# Patient Record
Sex: Female | Born: 1945 | Race: White | Hispanic: No | Smoking: Never smoker
Health system: Southern US, Community
[De-identification: ages and names within clinical notes are randomized; demographics above are authoritative.]

## PROBLEM LIST (undated history)

## (undated) DIAGNOSIS — N183 Chronic kidney disease, stage 3 unspecified: Secondary | ICD-10-CM

## (undated) DIAGNOSIS — K2100 Gastro-esophageal reflux disease with esophagitis, without bleeding: Secondary | ICD-10-CM

## (undated) DIAGNOSIS — I129 Hypertensive chronic kidney disease with stage 1 through stage 4 chronic kidney disease, or unspecified chronic kidney disease: Secondary | ICD-10-CM

## (undated) DIAGNOSIS — E039 Hypothyroidism, unspecified: Secondary | ICD-10-CM

## (undated) DIAGNOSIS — E663 Overweight: Secondary | ICD-10-CM

## (undated) DIAGNOSIS — F0390 Unspecified dementia without behavioral disturbance: Secondary | ICD-10-CM

## (undated) DIAGNOSIS — R7303 Prediabetes: Secondary | ICD-10-CM

## (undated) DIAGNOSIS — E785 Hyperlipidemia, unspecified: Secondary | ICD-10-CM

## (undated) HISTORY — DX: Prediabetes: R73.03

## (undated) HISTORY — DX: Hypothyroidism, unspecified: E03.9

## (undated) HISTORY — DX: Gastro-esophageal reflux disease with esophagitis: K21.0

## (undated) HISTORY — DX: Hypertensive chronic kidney disease with stage 1 through stage 4 chronic kidney disease, or unspecified chronic kidney disease: I12.9

## (undated) HISTORY — DX: Chronic kidney disease, stage 3 (moderate): N18.3

## (undated) HISTORY — PX: WRIST FRACTURE SURGERY: SHX121

## (undated) HISTORY — DX: Chronic kidney disease, stage 3 unspecified: N18.30

## (undated) HISTORY — DX: Hyperlipidemia, unspecified: E78.5

## (undated) HISTORY — DX: Unspecified dementia, unspecified severity, without behavioral disturbance, psychotic disturbance, mood disturbance, and anxiety: F03.90

## (undated) HISTORY — DX: Gastro-esophageal reflux disease with esophagitis, without bleeding: K21.00

## (undated) HISTORY — DX: Overweight: E66.3

---

## 1999-02-02 ENCOUNTER — Ambulatory Visit (HOSPITAL_BASED_OUTPATIENT_CLINIC_OR_DEPARTMENT_OTHER): Admission: RE | Admit: 1999-02-02 | Discharge: 1999-02-02 | Payer: Self-pay | Admitting: Orthopedic Surgery

## 1999-03-04 ENCOUNTER — Ambulatory Visit (HOSPITAL_BASED_OUTPATIENT_CLINIC_OR_DEPARTMENT_OTHER): Admission: RE | Admit: 1999-03-04 | Discharge: 1999-03-04 | Payer: Self-pay | Admitting: Orthopedic Surgery

## 2013-01-10 DIAGNOSIS — E7800 Pure hypercholesterolemia, unspecified: Secondary | ICD-10-CM | POA: Insufficient documentation

## 2013-07-24 DIAGNOSIS — G309 Alzheimer's disease, unspecified: Secondary | ICD-10-CM

## 2013-07-24 DIAGNOSIS — F0280 Dementia in other diseases classified elsewhere without behavioral disturbance: Secondary | ICD-10-CM | POA: Insufficient documentation

## 2014-02-14 ENCOUNTER — Other Ambulatory Visit (HOSPITAL_COMMUNITY)
Admission: RE | Admit: 2014-02-14 | Discharge: 2014-02-14 | Disposition: A | Discharge status: Home / Self Care | Payer: Medicare HMO | Source: Ambulatory Visit | Attending: Ophthalmology | Admitting: Ophthalmology

## 2014-02-14 ENCOUNTER — Other Ambulatory Visit: Payer: Self-pay | Admitting: Ophthalmology

## 2014-02-14 DIAGNOSIS — Z0389 Encounter for observation for other suspected diseases and conditions ruled out: Secondary | ICD-10-CM | POA: Insufficient documentation

## 2014-02-24 ENCOUNTER — Ambulatory Visit (HOSPITAL_COMMUNITY)
Admission: RE | Admit: 2014-02-24 | Discharge: 2014-02-24 | Disposition: A | Discharge status: Home / Self Care | Payer: Medicare HMO | Source: Ambulatory Visit | Attending: Ophthalmology | Admitting: Ophthalmology

## 2014-02-24 ENCOUNTER — Other Ambulatory Visit (HOSPITAL_COMMUNITY): Payer: Self-pay | Admitting: Ophthalmology

## 2014-02-24 DIAGNOSIS — H579 Unspecified disorder of eye and adnexa: Secondary | ICD-10-CM | POA: Diagnosis present

## 2014-02-24 DIAGNOSIS — D869 Sarcoidosis, unspecified: Secondary | ICD-10-CM | POA: Diagnosis not present

## 2014-02-24 DIAGNOSIS — I700 Atherosclerosis of aorta: Secondary | ICD-10-CM | POA: Diagnosis not present

## 2014-10-21 DIAGNOSIS — R002 Palpitations: Secondary | ICD-10-CM | POA: Insufficient documentation

## 2015-03-28 IMAGING — DX DG CHEST 2V
2 series · 2 of 2 positions shown · non-contrast
Comparison: 11/15/2012

CLINICAL DATA: Sarcoidosis.  Left eye problems.

EXAM:
CHEST  2 VIEW

[chest pa]
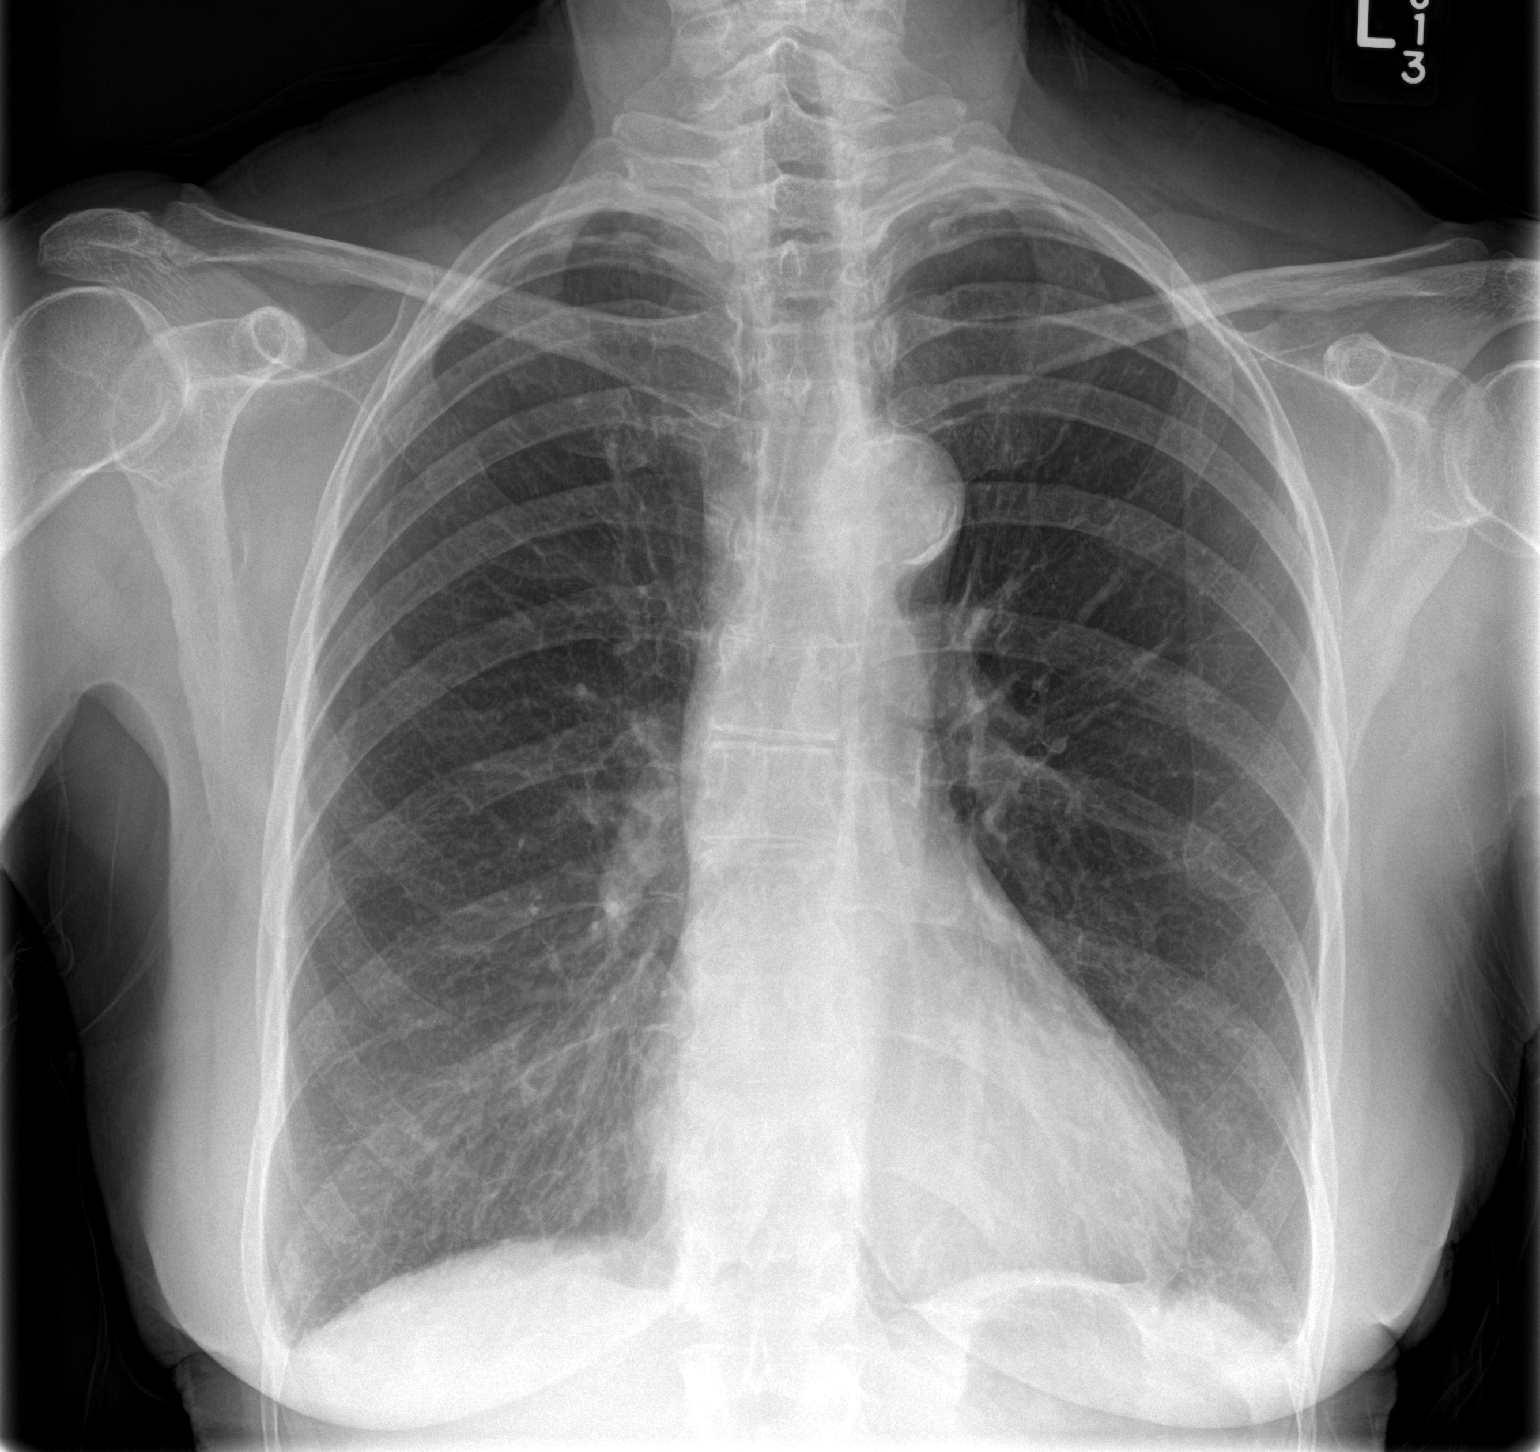

[chest lat]
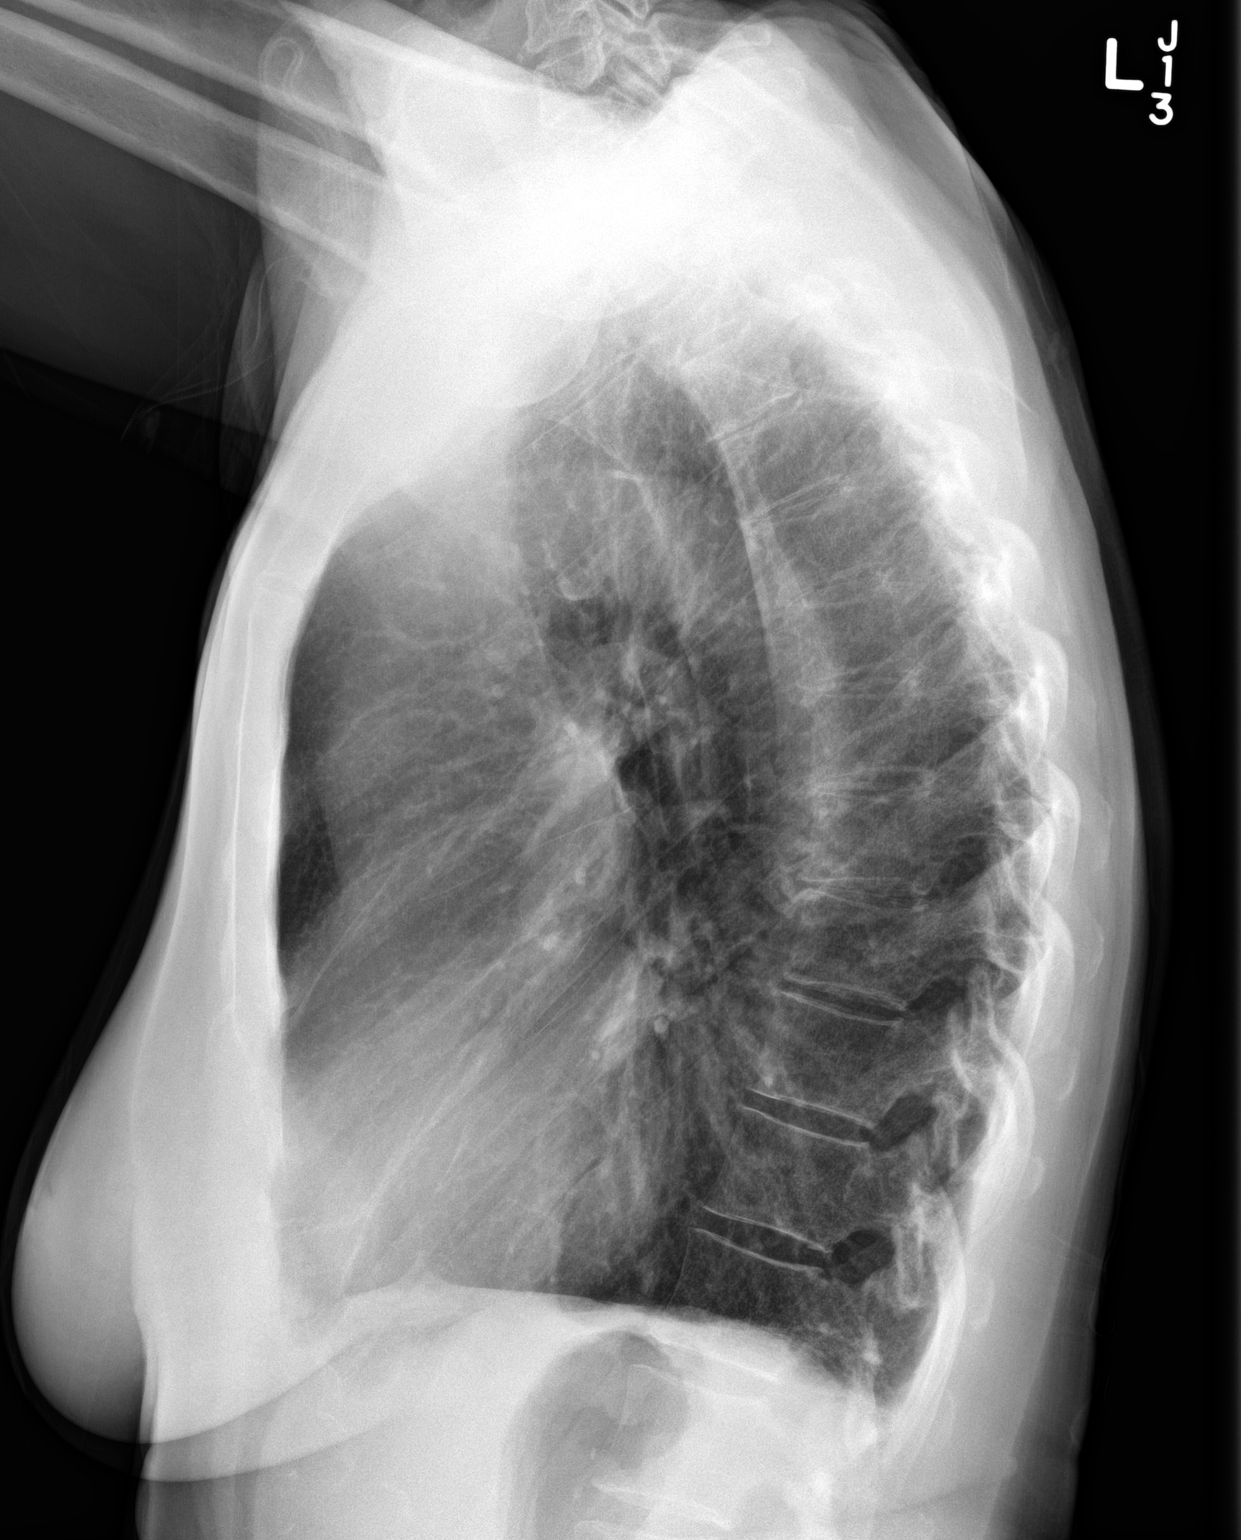

[2 of 2 positions shown; findings below may reference images not displayed]

FINDINGS: There is hyperinflation. Probable scarring at the lung apices.
Normal appearance of the heart and mediastinum. The thoracic aorta
has atherosclerotic calcifications. No acute bone abnormality. No
focal airspace disease.
IMPRESSION: Hyperinflation without acute chest findings.

## 2015-04-15 DIAGNOSIS — Z79899 Other long term (current) drug therapy: Secondary | ICD-10-CM | POA: Diagnosis not present

## 2015-04-15 DIAGNOSIS — N183 Chronic kidney disease, stage 3 (moderate): Secondary | ICD-10-CM | POA: Diagnosis not present

## 2015-04-15 DIAGNOSIS — E039 Hypothyroidism, unspecified: Secondary | ICD-10-CM | POA: Diagnosis not present

## 2015-04-15 DIAGNOSIS — K5909 Other constipation: Secondary | ICD-10-CM | POA: Diagnosis not present

## 2015-04-15 DIAGNOSIS — I129 Hypertensive chronic kidney disease with stage 1 through stage 4 chronic kidney disease, or unspecified chronic kidney disease: Secondary | ICD-10-CM | POA: Diagnosis not present

## 2015-04-15 DIAGNOSIS — E785 Hyperlipidemia, unspecified: Secondary | ICD-10-CM | POA: Diagnosis not present

## 2015-05-07 DIAGNOSIS — M797 Fibromyalgia: Secondary | ICD-10-CM | POA: Diagnosis not present

## 2015-05-07 DIAGNOSIS — Z79899 Other long term (current) drug therapy: Secondary | ICD-10-CM | POA: Diagnosis not present

## 2015-05-07 DIAGNOSIS — L4059 Other psoriatic arthropathy: Secondary | ICD-10-CM | POA: Diagnosis not present

## 2015-05-07 DIAGNOSIS — L409 Psoriasis, unspecified: Secondary | ICD-10-CM | POA: Diagnosis not present

## 2015-06-03 DIAGNOSIS — J22 Unspecified acute lower respiratory infection: Secondary | ICD-10-CM | POA: Diagnosis not present

## 2015-06-03 DIAGNOSIS — R1084 Generalized abdominal pain: Secondary | ICD-10-CM | POA: Diagnosis not present

## 2015-06-03 DIAGNOSIS — G309 Alzheimer's disease, unspecified: Secondary | ICD-10-CM | POA: Diagnosis not present

## 2015-06-19 DIAGNOSIS — Z1231 Encounter for screening mammogram for malignant neoplasm of breast: Secondary | ICD-10-CM | POA: Diagnosis not present

## 2016-04-06 DIAGNOSIS — R51 Headache: Secondary | ICD-10-CM | POA: Diagnosis not present

## 2016-04-06 DIAGNOSIS — M79604 Pain in right leg: Secondary | ICD-10-CM | POA: Diagnosis not present

## 2016-04-06 DIAGNOSIS — M7989 Other specified soft tissue disorders: Secondary | ICD-10-CM | POA: Diagnosis not present

## 2016-04-06 DIAGNOSIS — R9082 White matter disease, unspecified: Secondary | ICD-10-CM | POA: Diagnosis not present

## 2016-04-07 DIAGNOSIS — M79661 Pain in right lower leg: Secondary | ICD-10-CM | POA: Diagnosis not present

## 2016-04-07 DIAGNOSIS — R51 Headache: Secondary | ICD-10-CM | POA: Diagnosis not present

## 2016-04-07 DIAGNOSIS — M7989 Other specified soft tissue disorders: Secondary | ICD-10-CM | POA: Diagnosis not present

## 2016-04-15 DIAGNOSIS — M7121 Synovial cyst of popliteal space [Baker], right knee: Secondary | ICD-10-CM | POA: Diagnosis not present

## 2016-04-15 DIAGNOSIS — M79661 Pain in right lower leg: Secondary | ICD-10-CM | POA: Diagnosis not present

## 2016-04-15 DIAGNOSIS — M25561 Pain in right knee: Secondary | ICD-10-CM | POA: Diagnosis not present

## 2016-04-15 DIAGNOSIS — M7989 Other specified soft tissue disorders: Secondary | ICD-10-CM | POA: Diagnosis not present

## 2016-04-19 DIAGNOSIS — M47812 Spondylosis without myelopathy or radiculopathy, cervical region: Secondary | ICD-10-CM | POA: Diagnosis not present

## 2016-04-19 DIAGNOSIS — R600 Localized edema: Secondary | ICD-10-CM | POA: Diagnosis not present

## 2016-04-19 DIAGNOSIS — M79661 Pain in right lower leg: Secondary | ICD-10-CM | POA: Diagnosis not present

## 2016-07-08 DIAGNOSIS — G309 Alzheimer's disease, unspecified: Secondary | ICD-10-CM | POA: Diagnosis not present

## 2016-07-08 DIAGNOSIS — L4050 Arthropathic psoriasis, unspecified: Secondary | ICD-10-CM | POA: Diagnosis not present

## 2016-07-08 DIAGNOSIS — M2550 Pain in unspecified joint: Secondary | ICD-10-CM | POA: Diagnosis not present

## 2016-07-22 DIAGNOSIS — L409 Psoriasis, unspecified: Secondary | ICD-10-CM | POA: Diagnosis not present

## 2016-07-22 DIAGNOSIS — Z79899 Other long term (current) drug therapy: Secondary | ICD-10-CM | POA: Diagnosis not present

## 2016-07-22 DIAGNOSIS — M797 Fibromyalgia: Secondary | ICD-10-CM | POA: Diagnosis not present

## 2016-07-22 DIAGNOSIS — L4059 Other psoriatic arthropathy: Secondary | ICD-10-CM | POA: Diagnosis not present

## 2016-08-23 DIAGNOSIS — R252 Cramp and spasm: Secondary | ICD-10-CM | POA: Diagnosis not present

## 2016-10-21 DIAGNOSIS — L4059 Other psoriatic arthropathy: Secondary | ICD-10-CM | POA: Diagnosis not present

## 2016-10-21 DIAGNOSIS — Z79899 Other long term (current) drug therapy: Secondary | ICD-10-CM | POA: Diagnosis not present

## 2016-10-21 DIAGNOSIS — L409 Psoriasis, unspecified: Secondary | ICD-10-CM | POA: Diagnosis not present

## 2016-10-21 DIAGNOSIS — M797 Fibromyalgia: Secondary | ICD-10-CM | POA: Diagnosis not present

## 2016-10-26 DIAGNOSIS — R739 Hyperglycemia, unspecified: Secondary | ICD-10-CM | POA: Diagnosis not present

## 2016-10-26 DIAGNOSIS — Z1389 Encounter for screening for other disorder: Secondary | ICD-10-CM | POA: Diagnosis not present

## 2016-10-26 DIAGNOSIS — N183 Chronic kidney disease, stage 3 (moderate): Secondary | ICD-10-CM | POA: Diagnosis not present

## 2016-10-26 DIAGNOSIS — E785 Hyperlipidemia, unspecified: Secondary | ICD-10-CM | POA: Diagnosis not present

## 2016-10-26 DIAGNOSIS — I129 Hypertensive chronic kidney disease with stage 1 through stage 4 chronic kidney disease, or unspecified chronic kidney disease: Secondary | ICD-10-CM | POA: Diagnosis not present

## 2016-10-26 DIAGNOSIS — E039 Hypothyroidism, unspecified: Secondary | ICD-10-CM | POA: Diagnosis not present

## 2016-10-26 DIAGNOSIS — I10 Essential (primary) hypertension: Secondary | ICD-10-CM | POA: Diagnosis not present

## 2016-10-26 DIAGNOSIS — Z0000 Encounter for general adult medical examination without abnormal findings: Secondary | ICD-10-CM | POA: Diagnosis not present

## 2017-02-08 DIAGNOSIS — M25511 Pain in right shoulder: Secondary | ICD-10-CM | POA: Diagnosis not present

## 2017-02-21 DIAGNOSIS — L4059 Other psoriatic arthropathy: Secondary | ICD-10-CM | POA: Diagnosis not present

## 2017-02-21 DIAGNOSIS — L409 Psoriasis, unspecified: Secondary | ICD-10-CM | POA: Diagnosis not present

## 2017-02-21 DIAGNOSIS — M797 Fibromyalgia: Secondary | ICD-10-CM | POA: Diagnosis not present

## 2017-02-21 DIAGNOSIS — Z79899 Other long term (current) drug therapy: Secondary | ICD-10-CM | POA: Diagnosis not present

## 2017-02-21 DIAGNOSIS — R7989 Other specified abnormal findings of blood chemistry: Secondary | ICD-10-CM | POA: Diagnosis not present

## 2017-02-28 DIAGNOSIS — N183 Chronic kidney disease, stage 3 (moderate): Secondary | ICD-10-CM | POA: Diagnosis not present

## 2017-02-28 DIAGNOSIS — R7303 Prediabetes: Secondary | ICD-10-CM | POA: Diagnosis not present

## 2017-02-28 DIAGNOSIS — I129 Hypertensive chronic kidney disease with stage 1 through stage 4 chronic kidney disease, or unspecified chronic kidney disease: Secondary | ICD-10-CM | POA: Diagnosis not present

## 2017-02-28 DIAGNOSIS — E785 Hyperlipidemia, unspecified: Secondary | ICD-10-CM | POA: Diagnosis not present

## 2017-02-28 DIAGNOSIS — E559 Vitamin D deficiency, unspecified: Secondary | ICD-10-CM | POA: Diagnosis not present

## 2017-02-28 DIAGNOSIS — G309 Alzheimer's disease, unspecified: Secondary | ICD-10-CM | POA: Diagnosis not present

## 2017-02-28 DIAGNOSIS — Z23 Encounter for immunization: Secondary | ICD-10-CM | POA: Diagnosis not present

## 2017-02-28 DIAGNOSIS — E039 Hypothyroidism, unspecified: Secondary | ICD-10-CM | POA: Diagnosis not present

## 2017-03-17 DIAGNOSIS — N179 Acute kidney failure, unspecified: Secondary | ICD-10-CM | POA: Diagnosis not present

## 2017-03-19 DIAGNOSIS — S52592A Other fractures of lower end of left radius, initial encounter for closed fracture: Secondary | ICD-10-CM | POA: Diagnosis not present

## 2017-03-19 DIAGNOSIS — S52612A Displaced fracture of left ulna styloid process, initial encounter for closed fracture: Secondary | ICD-10-CM | POA: Diagnosis not present

## 2017-03-19 DIAGNOSIS — S62102A Fracture of unspecified carpal bone, left wrist, initial encounter for closed fracture: Secondary | ICD-10-CM | POA: Diagnosis not present

## 2017-03-19 DIAGNOSIS — S52572A Other intraarticular fracture of lower end of left radius, initial encounter for closed fracture: Secondary | ICD-10-CM | POA: Diagnosis not present

## 2017-03-20 DIAGNOSIS — S62002A Unspecified fracture of navicular [scaphoid] bone of left wrist, initial encounter for closed fracture: Secondary | ICD-10-CM | POA: Diagnosis not present

## 2017-03-24 DIAGNOSIS — S52502A Unspecified fracture of the lower end of left radius, initial encounter for closed fracture: Secondary | ICD-10-CM | POA: Diagnosis not present

## 2017-03-27 DIAGNOSIS — S52502A Unspecified fracture of the lower end of left radius, initial encounter for closed fracture: Secondary | ICD-10-CM | POA: Diagnosis not present

## 2017-03-27 DIAGNOSIS — I10 Essential (primary) hypertension: Secondary | ICD-10-CM | POA: Diagnosis not present

## 2017-03-27 DIAGNOSIS — E7800 Pure hypercholesterolemia, unspecified: Secondary | ICD-10-CM | POA: Diagnosis not present

## 2017-03-27 DIAGNOSIS — N3281 Overactive bladder: Secondary | ICD-10-CM | POA: Diagnosis not present

## 2017-03-27 DIAGNOSIS — S52572A Other intraarticular fracture of lower end of left radius, initial encounter for closed fracture: Secondary | ICD-10-CM | POA: Diagnosis not present

## 2017-03-27 DIAGNOSIS — E039 Hypothyroidism, unspecified: Secondary | ICD-10-CM | POA: Diagnosis not present

## 2017-03-27 DIAGNOSIS — Z79899 Other long term (current) drug therapy: Secondary | ICD-10-CM | POA: Diagnosis not present

## 2017-03-27 DIAGNOSIS — G8918 Other acute postprocedural pain: Secondary | ICD-10-CM | POA: Diagnosis not present

## 2017-03-27 DIAGNOSIS — E785 Hyperlipidemia, unspecified: Secondary | ICD-10-CM | POA: Diagnosis not present

## 2017-03-27 DIAGNOSIS — M1990 Unspecified osteoarthritis, unspecified site: Secondary | ICD-10-CM | POA: Diagnosis not present

## 2017-03-27 DIAGNOSIS — M4316 Spondylolisthesis, lumbar region: Secondary | ICD-10-CM | POA: Diagnosis not present

## 2017-03-27 DIAGNOSIS — G309 Alzheimer's disease, unspecified: Secondary | ICD-10-CM | POA: Diagnosis not present

## 2017-04-07 DIAGNOSIS — S52502A Unspecified fracture of the lower end of left radius, initial encounter for closed fracture: Secondary | ICD-10-CM | POA: Diagnosis not present

## 2017-04-11 DIAGNOSIS — M25512 Pain in left shoulder: Secondary | ICD-10-CM | POA: Diagnosis not present

## 2017-04-11 DIAGNOSIS — M25532 Pain in left wrist: Secondary | ICD-10-CM | POA: Diagnosis not present

## 2017-04-11 DIAGNOSIS — M25432 Effusion, left wrist: Secondary | ICD-10-CM | POA: Diagnosis not present

## 2017-04-11 DIAGNOSIS — M25632 Stiffness of left wrist, not elsewhere classified: Secondary | ICD-10-CM | POA: Diagnosis not present

## 2017-04-17 DIAGNOSIS — M25512 Pain in left shoulder: Secondary | ICD-10-CM | POA: Diagnosis not present

## 2017-04-17 DIAGNOSIS — M25432 Effusion, left wrist: Secondary | ICD-10-CM | POA: Diagnosis not present

## 2017-04-17 DIAGNOSIS — M25532 Pain in left wrist: Secondary | ICD-10-CM | POA: Diagnosis not present

## 2017-04-17 DIAGNOSIS — M25632 Stiffness of left wrist, not elsewhere classified: Secondary | ICD-10-CM | POA: Diagnosis not present

## 2017-04-25 DIAGNOSIS — R05 Cough: Secondary | ICD-10-CM | POA: Diagnosis not present

## 2017-04-25 DIAGNOSIS — J22 Unspecified acute lower respiratory infection: Secondary | ICD-10-CM | POA: Diagnosis not present

## 2017-05-09 DIAGNOSIS — S52509A Unspecified fracture of the lower end of unspecified radius, initial encounter for closed fracture: Secondary | ICD-10-CM | POA: Diagnosis not present

## 2017-05-15 DIAGNOSIS — J22 Unspecified acute lower respiratory infection: Secondary | ICD-10-CM | POA: Diagnosis not present

## 2017-06-13 DIAGNOSIS — S52502D Unspecified fracture of the lower end of left radius, subsequent encounter for closed fracture with routine healing: Secondary | ICD-10-CM | POA: Diagnosis not present

## 2017-06-14 ENCOUNTER — Encounter: Payer: Self-pay | Admitting: Neurology

## 2017-08-10 DIAGNOSIS — J0100 Acute maxillary sinusitis, unspecified: Secondary | ICD-10-CM | POA: Diagnosis not present

## 2017-08-17 DIAGNOSIS — Z13820 Encounter for screening for osteoporosis: Secondary | ICD-10-CM | POA: Diagnosis not present

## 2017-08-17 DIAGNOSIS — M899 Disorder of bone, unspecified: Secondary | ICD-10-CM | POA: Diagnosis not present

## 2017-08-23 DIAGNOSIS — Z79899 Other long term (current) drug therapy: Secondary | ICD-10-CM | POA: Diagnosis not present

## 2017-08-23 DIAGNOSIS — L4059 Other psoriatic arthropathy: Secondary | ICD-10-CM | POA: Diagnosis not present

## 2017-08-23 DIAGNOSIS — M797 Fibromyalgia: Secondary | ICD-10-CM | POA: Diagnosis not present

## 2017-08-23 DIAGNOSIS — R7989 Other specified abnormal findings of blood chemistry: Secondary | ICD-10-CM | POA: Diagnosis not present

## 2017-08-23 DIAGNOSIS — L409 Psoriasis, unspecified: Secondary | ICD-10-CM | POA: Diagnosis not present

## 2017-09-01 ENCOUNTER — Ambulatory Visit: Payer: Medicare Other | Admitting: Neurology

## 2017-09-01 ENCOUNTER — Other Ambulatory Visit: Payer: Self-pay

## 2017-09-01 ENCOUNTER — Encounter: Payer: Self-pay | Admitting: Neurology

## 2017-09-01 VITALS — BP 112/74 | HR 52 | Wt 155.0 lb

## 2017-09-01 DIAGNOSIS — F02818 Dementia in other diseases classified elsewhere, unspecified severity, with other behavioral disturbance: Secondary | ICD-10-CM

## 2017-09-01 DIAGNOSIS — F0281 Dementia in other diseases classified elsewhere with behavioral disturbance: Secondary | ICD-10-CM | POA: Diagnosis not present

## 2017-09-01 DIAGNOSIS — G301 Alzheimer's disease with late onset: Secondary | ICD-10-CM

## 2017-09-01 MED ORDER — MEMANTINE HCL ER 28 MG PO CP24: 28.0000 mg | ORAL_CAPSULE | Freq: Every day | ORAL | 3 refills | Status: DC

## 2017-09-01 MED ORDER — GALANTAMINE HYDROBROMIDE ER 8 MG PO CP24: ORAL_CAPSULE | ORAL | 3 refills | Status: DC

## 2017-09-01 NOTE — Patient Instructions (Signed)
1. Continue Galantamine ER 8mg  3 tablets daily and Namenda XR 28mg  daily 2. If behavioral changes worsen, we can start a medication to help some 3. Follow-up in 6 months, call for any changes  FALL PRECAUTIONS: Be cautious when walking. Scan the area for obstacles that may increase the risk of trips and falls. When getting up in the mornings, sit up at the edge of the bed for a few minutes before getting out of bed. Consider elevating the bed at the head end to avoid drop of blood pressure when getting up. Walk always in a well-lit room (use night lights in the walls). Avoid area rugs or power cords from appliances in the middle of the walkways. Use a walker or a cane if necessary and consider physical therapy for balance exercise. Get your eyesight checked regularly.  FINANCIAL OVERSIGHT: Supervision, especially oversight when making financial decisions or transactions is also recommended.  HOME SAFETY: Consider the safety of the kitchen when operating appliances like stoves, microwave oven, and blender. Consider having supervision and share cooking responsibilities until no longer able to participate in those. Accidents with firearms and other hazards in the house should be identified and addressed as well.  DRIVING: Regarding driving, in patients with progressive memory problems, driving will be impaired. We advise to have someone else do the driving if trouble finding directions or if minor accidents are reported. Independent driving assessment is available to determine safety of driving.  ABILITY TO BE LEFT ALONE: If patient is unable to contact 911 operator, consider using LifeLine, or when the need is there, arrange for someone to stay with patients. Smoking is a fire hazard, consider supervision or cessation. Risk of wandering should be assessed by caregiver and if detected at any point, supervision and safe proof recommendations should be instituted.  MEDICATION SUPERVISION: Inability to  self-administer medication needs to be constantly addressed. Implement a mechanism to ensure safe administration of the medications.  RECOMMENDATIONS FOR ALL PATIENTS WITH MEMORY PROBLEMS: 1. Continue to exercise (Recommend 30 minutes of walking everyday, or 3 hours every week) 2. Increase social interactions - continue going to [place] and enjoy social gatherings with friends and family 3. Eat healthy, avoid fried foods and eat more fruits and vegetables 4. Maintain adequate blood pressure, blood sugar, and blood cholesterol level. Reducing the risk of stroke and cardiovascular disease also helps promoting better memory. 5. Avoid stressful situations. Live a simple life and avoid aggravations. Organize your time and prepare for the next day in anticipation. 6. Sleep well, avoid any interruptions of sleep and avoid any distractions in the bedroom that may interfere with adequate sleep quality 7. Avoid sugar, avoid sweets as there is a strong link between excessive sugar intake, diabetes, and cognitive impairment The Mediterranean diet has been shown to help patients reduce the risk of progressive memory disorders and reduces cardiovascular risk. This includes eating fish, eat fruits and green leafy vegetables, nuts like almonds and hazelnuts, walnuts, and also use olive oil. Avoid fast foods and fried foods as much as possible. Avoid sweets and sugar as sugar use has been linked to worsening of memory function.  There is always a concern of gradual progression of memory problems. If this is the case, then we may need to adjust level of care according to patient needs. Support, both to the patient and caregiver, should then be put into place.

## 2017-09-01 NOTE — Progress Notes (Signed)
NEUROLOGY CONSULTATION NOTE  Paisely Brick MRN: [id number] DOB: 1945/05/23  Referring provider: Caswell Corwin, NP Primary care provider: Caswell Corwin, NP  Reason for consult:  dementia  Thank you for your kind referral of Sidni Fusco for consultation of the above symptoms. Although her history is well known to you, please allow me to reiterate it for the purpose of our medical record. The patient was accompanied to the clinic by her daughter who also provides collateral information. Records and images were personally reviewed where available.   HISTORY OF PRESENT ILLNESS: This is a pleasant 72 year old right-handed woman with a history of hyperlipidemia, hypothyroidism, Alzheimer's disease, presenting to establish care. She had been going to [facility] until their practice closed, records were reviewed. Symptoms started around 2014 and have progressively worsened over time. She moved in with her daughter in 2017 and stopped driving around that time as well. Her daughter administers medications and manages finances. She hand bathes in the sink daily, but after she broke her arm in December, Sunday was the first time she got in the shower. A neighbor washes her hair weekly. She is able to dress herself independently. She misplaces things frequently at home. She does not wander outside the house, but a couple of times she has gone to the porch saying she would feed the dog. She is awake until 3am, then sleeps until 11am. She has 24/7 supervision at home. Her daughter reports aggressiveness every 1-2 weeks, "nothing major," but to the point that she would try to hit family. These mostly occur when they are needing to go somewhere or when they ask her to take a shower. No paranoia or hallucinations. Last MMSE 20/30 in December 2017. She is taking Galantamine ER 8mg  3 tabs daily and Namenda XR 28mg  daily without side effects.   She denies any headaches, dizziness, diplopia, dysarthria/dysphagia, neck/back  pain, focal numbness/tingling/weakness, anosmia, or tremors. She wears pads for urinary incontinence. She denies any falls.   Diagnostic Data: Per Dr. 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  PAST MEDICAL HISTORY: Past Medical History:  Diagnosis Date  . Dementia   . Hyperlipidemia   . Hypothyroidism     PAST SURGICAL HISTORY: History reviewed. No pertinent surgical history.  MEDICATIONS: Current Outpatient Medications on File Prior to Visit  Medication Sig Dispense Refill  . Apremilast (OTEZLA) 30 MG TABS Take by mouth.    . Ascorbic Acid (VITAMIN C) 100 MG tablet Take 100 mg by mouth daily.    . cetirizine (ZYRTEC) 10 MG tablet Take 10 mg by mouth daily.    Marland Kitchen docusate sodium (COLACE) 100 MG capsule Take 100 mg by mouth 2 (two) times daily.    Marland Kitchen gabapentin (NEURONTIN) 300 MG capsule Take 300 mg by mouth 3 (three) times daily.    Marland Kitchen GALANTAMINE HYDROBROMIDE PO Take 8 mg by mouth.    . levothyroxine (SYNTHROID, LEVOTHROID) 88 MCG tablet Take 88 mcg by mouth daily before breakfast.    . memantine (NAMENDA XR) 28 MG CP24 24 hr capsule Take 28 mg by mouth.    . metoprolol tartrate (LOPRESSOR) 50 MG tablet Take 50 mg by mouth 2 (two) times daily.    . nitroGLYCERIN (NITROSTAT) 0.4 MG SL tablet Place 0.4 mg under the tongue every 5 (five) minutes as needed for chest pain.    Marland Kitchen POLYETHYLENE GLYCOL 3350 PO Take by mouth.    . ranitidine (ZANTAC) 150 MG tablet  Take 150 mg by mouth 2 (two) times daily.     No current facility-administered medications on file prior to visit.     ALLERGIES: Allergies not on file  FAMILY HISTORY: No family history on file.  SOCIAL HISTORY: Social History   Socioeconomic History  . Marital status: Married    Spouse name: Not on file  . Number of children: Not on file  . Years of education: Not on file  . Highest education level: Not on file  Occupational History  . Not on file  Social  Needs  . Financial resource strain: Not on file  . Food insecurity:    Worry: Not on file    Inability: Not on file  . Transportation needs:    Medical: Not on file    Non-medical: Not on file  Tobacco Use  . Smoking status: Not on file  Substance and Sexual Activity  . Alcohol use: Not on file  . Drug use: Not on file  . Sexual activity: Not on file  Lifestyle  . Physical activity:    Days per week: Not on file    Minutes per session: Not on file  . Stress: Not on file  Relationships  . Social connections:    Talks on phone: Not on file    Gets together: Not on file    Attends religious service: Not on file    Active member of club or organization: Not on file    Attends meetings of clubs or organizations: Not on file    Relationship status: Not on file  . Intimate partner violence:    Fear of current or ex partner: Not on file    Emotionally abused: Not on file    Physically abused: Not on file    Forced sexual activity: Not on file  Other Topics Concern  . Not on file  Social History Narrative  . Not on file    REVIEW OF SYSTEMS: Constitutional: No fevers, chills, or sweats, no generalized fatigue, change in appetite Eyes: No visual changes, double vision, eye pain Ear, nose and throat: No hearing loss, ear pain, nasal congestion, sore throat Cardiovascular: No chest pain, palpitations Respiratory:  No shortness of breath at rest or with exertion, wheezes GastrointestinaI: No nausea, vomiting, diarrhea, abdominal pain, fecal incontinence Genitourinary:  No dysuria, urinary retention or frequency Musculoskeletal:  No neck pain, back pain Integumentary: No rash, pruritus, skin lesions Neurological: as above Psychiatric: No depression, insomnia, anxiety Endocrine: No palpitations, fatigue, diaphoresis, mood swings, change in appetite, change in weight, increased thirst Hematologic/Lymphatic:  No anemia, purpura, petechiae. Allergic/Immunologic: no itchy/runny eyes,  nasal congestion, recent allergic reactions, rashes  PHYSICAL EXAM: Vitals:   09/01/17 0929  BP: 112/74  Pulse: (!) 52  SpO2: 95%   General: No acute distress Head:  Normocephalic/atraumatic Eyes: Fundoscopic exam shows bilateral sharp discs, no vessel changes, exudates, or hemorrhages Neck: supple, no paraspinal tenderness, full range of motion Back: No paraspinal tenderness Heart: regular rate and rhythm Lungs: Clear to auscultation bilaterally. Vascular: No carotid bruits. Skin/Extremities: No rash, no edema Neurological Exam: Mental status: alert and oriented to person, place, no dysarthria or aphasia, Fund of knowledge is reduced.  Recent and remote memory are impaired.  Attention and concentration are normal.    Able to name objects and repeat phrases. CDT 4/5 MMSE - Mini Mental State Exam 09/01/2017  Orientation to time 1  Orientation to Place 4  Registration 3  Attention/ Calculation 5  Recall 0  Language- name 2 objects 2  Language- repeat 1  Language- follow 3 step command 3  Language- read & follow direction 1  Write a sentence 1  Copy design 1  Total score 22   Cranial nerves: CN I: not tested CN II: pupils equal, round and reactive to light, visual fields intact, fundi unremarkable. CN III, IV, VI:  full range of motion, no nystagmus, no ptosis CN V: facial sensation intact CN VII: upper and lower face symmetric CN VIII: hearing intact to finger rub CN IX, X: gag intact, uvula midline CN XI: sternocleidomastoid and trapezius muscles intact CN XII: tongue midline Bulk & Tone: normal, no fasciculations. Motor: 5/5 throughout with no pronator drift. Sensation: intact to light touch, cold, pin, vibration and joint position sense.  No extinction to double simultaneous stimulation.  Romberg test negative Deep Tendon Reflexes: +2 throughout, no ankle clonus Plantar responses: downgoing bilaterally Cerebellar: no incoordination on finger to nose, heel to shin. No  dysdiadochokinesia Gait: narrow-based and steady, holding hands over hips when walking, no ataxia Tremor: none  IMPRESSION: This is a pleasant 72 year old right-handed woman with a history of hyperlipidemia, hypothyroidism, presenting to establish care for Alzheimer's dementia. MMSE today 22/30, symptoms suggestive of mild to moderate dementia. She is on Galantamine ER 24mg  daily and Namenda XR 28mg  daily without side effects, refills sent. We discussed behavioral changes that can worsen with dementia, continue to monitor, she may benefit from an SSRI in the future. Continue 24/7 supervision. She does not drive. We discussed the importance of control of vascular risk factors, physical exercise, and brain stimulation exercises. She will follow-up in 6 months and knows to call for any changes.   Thank you for allowing me to participate in the care of this patient. Please do not hesitate to call for any questions or concerns.   Ellouise Newer, M.D.  CC: Caswell Corwin, NP

## 2017-09-19 ENCOUNTER — Telehealth: Payer: Self-pay | Admitting: Neurology

## 2017-09-19 NOTE — Telephone Encounter (Signed)
LOV notes faxed to number below.  Confirmation received.

## 2017-10-17 DIAGNOSIS — I10 Essential (primary) hypertension: Secondary | ICD-10-CM | POA: Diagnosis not present

## 2017-10-17 DIAGNOSIS — I499 Cardiac arrhythmia, unspecified: Secondary | ICD-10-CM | POA: Diagnosis not present

## 2017-10-17 DIAGNOSIS — J90 Pleural effusion, not elsewhere classified: Secondary | ICD-10-CM | POA: Diagnosis not present

## 2017-10-17 DIAGNOSIS — R0902 Hypoxemia: Secondary | ICD-10-CM | POA: Diagnosis not present

## 2017-10-17 DIAGNOSIS — I471 Supraventricular tachycardia: Secondary | ICD-10-CM | POA: Diagnosis not present

## 2017-10-17 DIAGNOSIS — F0390 Unspecified dementia without behavioral disturbance: Secondary | ICD-10-CM

## 2017-10-17 DIAGNOSIS — R0602 Shortness of breath: Secondary | ICD-10-CM | POA: Diagnosis not present

## 2017-10-17 DIAGNOSIS — M1990 Unspecified osteoarthritis, unspecified site: Secondary | ICD-10-CM | POA: Diagnosis not present

## 2017-10-17 DIAGNOSIS — R000 Tachycardia, unspecified: Secondary | ICD-10-CM

## 2017-10-19 DIAGNOSIS — Z79899 Other long term (current) drug therapy: Secondary | ICD-10-CM | POA: Diagnosis not present

## 2017-10-19 DIAGNOSIS — I4891 Unspecified atrial fibrillation: Secondary | ICD-10-CM | POA: Diagnosis not present

## 2017-10-19 DIAGNOSIS — M79604 Pain in right leg: Secondary | ICD-10-CM | POA: Diagnosis not present

## 2017-10-19 DIAGNOSIS — E785 Hyperlipidemia, unspecified: Secondary | ICD-10-CM | POA: Diagnosis not present

## 2017-10-19 DIAGNOSIS — R7303 Prediabetes: Secondary | ICD-10-CM | POA: Diagnosis not present

## 2017-10-19 DIAGNOSIS — I129 Hypertensive chronic kidney disease with stage 1 through stage 4 chronic kidney disease, or unspecified chronic kidney disease: Secondary | ICD-10-CM | POA: Diagnosis not present

## 2017-10-19 DIAGNOSIS — E039 Hypothyroidism, unspecified: Secondary | ICD-10-CM | POA: Diagnosis not present

## 2017-10-19 DIAGNOSIS — M7989 Other specified soft tissue disorders: Secondary | ICD-10-CM | POA: Diagnosis not present

## 2017-10-20 ENCOUNTER — Encounter: Payer: Self-pay | Admitting: Emergency Medicine

## 2017-10-20 DIAGNOSIS — E039 Hypothyroidism, unspecified: Secondary | ICD-10-CM

## 2017-10-20 DIAGNOSIS — M79606 Pain in leg, unspecified: Secondary | ICD-10-CM | POA: Insufficient documentation

## 2017-10-20 DIAGNOSIS — R12 Heartburn: Secondary | ICD-10-CM | POA: Insufficient documentation

## 2017-10-20 DIAGNOSIS — M79604 Pain in right leg: Secondary | ICD-10-CM

## 2017-10-20 DIAGNOSIS — M79605 Pain in left leg: Secondary | ICD-10-CM

## 2017-10-20 DIAGNOSIS — I10 Essential (primary) hypertension: Secondary | ICD-10-CM

## 2017-10-23 ENCOUNTER — Encounter: Payer: Self-pay | Admitting: *Deleted

## 2017-10-23 ENCOUNTER — Ambulatory Visit: Payer: Medicare Other | Admitting: Cardiology

## 2017-10-23 ENCOUNTER — Encounter: Payer: Self-pay | Admitting: Cardiology

## 2017-10-23 VITALS — BP 130/64 | HR 97 | Ht 64.0 in | Wt 159.8 lb

## 2017-10-23 DIAGNOSIS — I498 Other specified cardiac arrhythmias: Secondary | ICD-10-CM | POA: Diagnosis not present

## 2017-10-23 DIAGNOSIS — I10 Essential (primary) hypertension: Secondary | ICD-10-CM | POA: Diagnosis not present

## 2017-10-23 DIAGNOSIS — R079 Chest pain, unspecified: Secondary | ICD-10-CM | POA: Insufficient documentation

## 2017-10-23 DIAGNOSIS — R002 Palpitations: Secondary | ICD-10-CM

## 2017-10-23 DIAGNOSIS — R778 Other specified abnormalities of plasma proteins: Secondary | ICD-10-CM | POA: Insufficient documentation

## 2017-10-23 DIAGNOSIS — R748 Abnormal levels of other serum enzymes: Secondary | ICD-10-CM

## 2017-10-23 DIAGNOSIS — R7989 Other specified abnormal findings of blood chemistry: Secondary | ICD-10-CM

## 2017-10-23 NOTE — Patient Instructions (Signed)
Medication Instructions:  Your physician recommends that you continue on your current medications as directed. Please refer to the Current Medication list given to you today.   Labwork: Your physician recommends that you return for lab work today: TSH.   Testing/Procedures: Your physician has requested that you have an echocardiogram. Echocardiography is a painless test that uses sound waves to create images of your heart. It provides your doctor with information about the size and shape of your heart and how well your heart's chambers and valves are working. This procedure takes approximately one hour. There are no restrictions for this procedure.  Your physician has requested that you have a lexiscan myoview. For further information please visit [email or web address]. Please follow instruction sheet, as given.  Your physician has recommended that you wear a holter monitor. Holter monitors are medical devices that record the heart's electrical activity. Doctors most often use these monitors to diagnose arrhythmias. Arrhythmias are problems with the speed or rhythm of the heartbeat. The monitor is a small, portable device. You can wear one while you do your normal daily activities. This is usually used to diagnose what is causing palpitations/syncope (passing out). Wear for 48 hours.   Follow-Up: Your physician wants you to follow-up in: 6 months. You will receive a reminder letter in the mail two months in advance. If you don't receive a letter, please call our office to schedule the follow-up appointment.    If you need a refill on your cardiac medications before your next appointment, please call your pharmacy.   Thank you for choosing [facility]! Robyne Peers, RN [phone]     Cardiac Nuclear Scan A cardiac nuclear scan is a test that measures blood flow to the heart when a person is resting and when he or she is exercising. The test looks for problems such as:  Not  enough blood reaching a portion of the heart.  The heart muscle not working normally.  You may need this test if:  You have heart disease.  You have had abnormal lab results.  You have had heart surgery or angioplasty.  You have chest pain.  You have shortness of breath.  In this test, a radioactive dye (tracer) is injected into your bloodstream. After the tracer has traveled to your heart, an imaging device is used to measure how much of the tracer is absorbed by or distributed to various areas of your heart. This procedure is usually done at a hospital and takes 2-4 hours. Tell a health care provider about:  Any allergies you have.  All medicines you are taking, including vitamins, herbs, eye drops, creams, and over-the-counter medicines.  Any problems you or family members have had with the use of anesthetic medicines.  Any blood disorders you have.  Any surgeries you have had.  Any medical conditions you have.  Whether you are pregnant or may be pregnant. What are the risks? Generally, this is a safe procedure. However, problems may occur, including:  Serious chest pain and heart attack. This is only a risk if the stress portion of the test is done.  Rapid heartbeat.  Sensation of warmth in your chest. This usually passes quickly.  What happens before the procedure?  Ask your health care provider about changing or stopping your regular medicines. This is especially important if you are taking diabetes medicines or blood thinners.  Remove your jewelry on the day of the procedure. What happens during the procedure?  An IV tube will be inserted into  one of your veins.  Your health care provider will inject a small amount of radioactive tracer through the tube.  You will wait for 20-40 minutes while the tracer travels through your bloodstream.  Your heart activity will be monitored with an electrocardiogram (ECG).  You will lie down on an exam table.  Images  of your heart will be taken for about 15-20 minutes.  You may be asked to exercise on a treadmill or stationary bike. While you exercise, your heart's activity will be monitored with an ECG, and your blood pressure will be checked. If you are unable to exercise, you may be given a medicine to increase blood flow to parts of your heart.  When blood flow to your heart has peaked, a tracer will again be injected through the IV tube.  After 20-40 minutes, you will get back on the exam table and have more images taken of your heart.  When the procedure is over, your IV tube will be removed. The procedure may vary among health care providers and hospitals. Depending on the type of tracer used, scans may need to be repeated 3-4 hours later. What happens after the procedure?  Unless your health care provider tells you otherwise, you may return to your normal schedule, including diet, activities, and medicines.  Unless your health care provider tells you otherwise, you may increase your fluid intake. This will help flush the contrast dye from your body. Drink enough fluid to keep your urine clear or pale yellow.  It is up to you to get your test results. Ask your health care provider, or the department that is doing the test, when your results will be ready. Summary  A cardiac nuclear scan measures the blood flow to the heart when a person is resting and when he or she is exercising.  You may need this test if you are at risk for heart disease.  Tell your health care provider if you are pregnant.  Unless your health care provider tells you otherwise, increase your fluid intake. This will help flush the contrast dye from your body. Drink enough fluid to keep your urine clear or pale yellow. This information is not intended to replace advice given to you by your health care provider. Make sure you discuss any questions you have with your health care provider. Document Released: 04/08/2004 Document  Revised: 03/16/2016 Document Reviewed: 02/20/2013 Elsevier Interactive Patient Education  2017 [place].    Echocardiogram An echocardiogram, or echocardiography, uses sound waves (ultrasound) to produce an image of your heart. The echocardiogram is simple, painless, obtained within a short period of time, and offers valuable information to your health care provider. The images from an echocardiogram can provide information such as:  Evidence of coronary artery disease (CAD).  Heart size.  Heart muscle function.  Heart valve function.  Aneurysm detection.  Evidence of a past heart attack.  Fluid buildup around the heart.  Heart muscle thickening.  Assess heart valve function.  Tell a health care provider about:  Any allergies you have.  All medicines you are taking, including vitamins, herbs, eye drops, creams, and over-the-counter medicines.  Any problems you or family members have had with anesthetic medicines.  Any blood disorders you have.  Any surgeries you have had.  Any medical conditions you have.  Whether you are pregnant or may be pregnant. What happens before the procedure? No special preparation is needed. Eat and drink normally. What happens during the procedure?  In order  to produce an image of your heart, gel will be applied to your chest and a wand-like tool (transducer) will be moved over your chest. The gel will help transmit the sound waves from the transducer. The sound waves will harmlessly bounce off your heart to allow the heart images to be captured in real-time motion. These images will then be recorded.  You may need an IV to receive a medicine that improves the quality of the pictures. What happens after the procedure? You may return to your normal schedule including diet, activities, and medicines, unless your health care provider tells you otherwise. This information is not intended to replace advice given to you by your health care  provider. Make sure you discuss any questions you have with your health care provider. Document Released: 03/11/2000 Document Revised: 10/31/2015 Document Reviewed: 11/19/2012 Elsevier Interactive Patient Education  2017 [place].     Holter Monitoring A Holter monitor is a small device that is used to detect abnormal heart rhythms. It clips to your clothing and is connected by wires to flat, sticky disks (electrodes) that attach to your chest. It is worn continuously for 24-48 hours. Follow these instructions at home:  Wear your Holter monitor at all times, even while exercising and sleeping, for as long as directed by your health care provider.  Make sure that the Holter monitor is safely clipped to your clothing or close to your body as recommended by your health care provider.  Do not get the monitor or wires wet.  Do not put body lotion or moisturizer on your chest.  Keep your skin clean.  Keep a diary of your daily activities, such as walking and doing chores. If you feel that your heartbeat is abnormal or that your heart is fluttering or skipping a beat: ? Record what you are doing when it happens. ? Record what time of day the symptoms occur.  Return your Holter monitor as directed by your health care provider.  Keep all follow-up visits as directed by your health care provider. This is important. Get help right away if:  You feel lightheaded or you faint.  You have trouble breathing.  You feel pain in your chest, upper arm, or jaw.  You feel sick to your stomach and your skin is pale, cool, or damp.  You heartbeat feels unusual or abnormal. This information is not intended to replace advice given to you by your health care provider. Make sure you discuss any questions you have with your health care provider. Document Released: 12/11/2003 Document Revised: 08/20/2015 Document Reviewed: 10/21/2013 Elsevier Interactive Patient Education  [employer].

## 2017-10-23 NOTE — Progress Notes (Signed)
Cardiology Office Note:    Date:  10/23/2017   ID:  Daisy Barajas, DOB Jul 31, 1945, MRN [id number]  PCP:  [facility]  Cardiologist:  Jenean Lindau, MD   Referring MD: [facility]    ASSESSMENT:    1. Palpitations   2. Elevated troponin I level   3. Essential hypertension   4. Sinus arrhythmia    PLAN:    In order of problems listed above:  1. Primary prevention stressed with the patient.  Importance of compliance with diet and medication stressed and she vocalized understanding. 2. I reassured her granddaughter that this appears to be sinus arrhythmia and I am also in agreement with the hospital doctors. 3. We will try to obtain Holter monitoring 48-hour to see if she has any other rhythm issues.  On beta-blocker which was reinstituted she seems to be doing fine and has no symptoms.  We will obtain TSH in view of palpitations. 4. She had a positive troponin and we would like to do a Lexiscan sestamibi.  Echocardiogram will be done to assess murmur heard in auscultation.  Patient will be seen in follow-up appointment in 6 months or earlier if she has any concerns. 5. She knows to go to the nearest emergency room if there are any significant concerns.   Medication Adjustments/Labs and Tests Ordered: Current medicines are reviewed at length with the patient today.  Concerns regarding medicines are outlined above.  Orders Placed This Encounter  Procedures  . TSH  . MYOCARDIAL PERFUSION IMAGING  . Holter monitor - 48 hour  . ECHOCARDIOGRAM COMPLETE   No orders of the defined types were placed in this encounter.    History of Present Illness:    Daisy Barajas is a 72 y.o. female who is being seen today for the evaluation of palpitations, abnormal troponin and abnormal EKG at the request [facility] health outpatient services.  The patient is a pleasant 72 year old female.  I have seen her many years ago as an outpatient.  She has history of essential  hypertension, dementia according to the notes.  She did not get her beta-blocker failed from the pharmacy and had tachyarrhythmias and went to the hospital.  In the hospital she was found to be in sinus arrhythmia.  Subsequently she was treated and discharged.  She denies any chest pain orthopnea or PND.  She is an elderly woman and is a poor historian.  History is also obtained by her daughter and her granddaughter.  Her daughter communicated by the phone.  At the time of my evaluation, the patient is alert awake oriented and in no distress.  Past Medical History:  Diagnosis Date  . Benign hypertension with CKD (chronic kidney disease) stage III ([place])   . Dementia   . Hyperlipidemia   . Hypothyroidism   . Overweight   . Prediabetes   . Reflux esophagitis     Past Surgical History:  Procedure Laterality Date  . WRIST FRACTURE SURGERY      Current Medications: Current Meds  Medication Sig  . Apremilast (OTEZLA) 30 MG TABS Take by mouth.  . Ascorbic Acid (VITAMIN C) 100 MG tablet Take 100 mg by mouth daily.  . cetirizine (ZYRTEC) 10 MG tablet Take 10 mg by mouth daily.  Marland Kitchen docusate sodium (COLACE) 100 MG capsule Take 100 mg by mouth 2 (two) times daily.  Marland Kitchen gabapentin (NEURONTIN) 300 MG capsule Take 300 mg by mouth 3 (three) times daily.  Marland Kitchen galantamine (RAZADYNE ER) 8  MG 24 hr capsule Take 3 tablets daily  . levothyroxine (SYNTHROID, LEVOTHROID) 88 MCG tablet Take 88 mcg by mouth daily before breakfast.  . memantine (NAMENDA XR) 28 MG CP24 24 hr capsule Take 1 capsule (28 mg total) by mouth daily.  . metoprolol tartrate (LOPRESSOR) 50 MG tablet Take 50 mg by mouth 2 (two) times daily.  . nitroGLYCERIN (NITROSTAT) 0.4 MG SL tablet Place 0.4 mg under the tongue every 5 (five) minutes as needed for chest pain.  . polyethylene glycol (MIRALAX / GLYCOLAX) packet Take by mouth.  . ranitidine (ZANTAC) 150 MG tablet Take 150 mg by mouth 2 (two) times daily.     Allergies:   Bee venom and  Iodinated diagnostic agents   Social History   Socioeconomic History  . Marital status: Widowed    Spouse name: Not on file  . Number of children: Not on file  . Years of education: Not on file  . Highest education level: Not on file  Occupational History  . Not on file  Social Needs  . Financial resource strain: Not on file  . Food insecurity:    Worry: Not on file    Inability: Not on file  . Transportation needs:    Medical: Not on file    Non-medical: Not on file  Tobacco Use  . Smoking status: Never Smoker  . Smokeless tobacco: Never Used  Substance and Sexual Activity  . Alcohol use: Not Currently  . Drug use: Never  . Sexual activity: Not on file  Lifestyle  . Physical activity:    Days per week: Not on file    Minutes per session: Not on file  . Stress: Not on file  Relationships  . Social connections:    Talks on phone: Not on file    Gets together: Not on file    Attends religious service: Not on file    Active member of club or organization: Not on file    Attends meetings of clubs or organizations: Not on file    Relationship status: Not on file  Other Topics Concern  . Not on file  Social History Narrative   Pt lives in 1 story home with her daughter and son-in-law   Has 2 children   12th grade education   Retired Regulatory affairs officer      Family History: The patient's family history includes Alzheimer's disease in her mother; Dementia in her mother; Heart disease in her father.  ROS:   Please see the history of present illness.    All other systems reviewed and are negative.  EKGs/Labs/Other Studies Reviewed:    The following studies were reviewed today: EKG reveals sinus rhythm and sinus arrhythmia.  This is the only EKG I could review.  It seems like this is the EKG the daughter was referring to.  We also called [facility] and also looked up the software and records to check for any other EKG and was told that this was the only one.   Recent  Labs: No results found for requested labs within last 8760 hours.  Recent Lipid Panel No results found for: CHOL, TRIG, HDL, CHOLHDL, VLDL, LDLCALC, LDLDIRECT  Physical Exam:    VS:  BP 130/64   Pulse 97   Ht 5\' 4"  (1.626 m)   Wt 159 lb 12.8 oz (72.5 kg)   SpO2 97%   BMI 27.43 kg/m     Wt Readings from Last 3 Encounters:  10/23/17 159 lb 12.8 oz (  72.5 kg)  09/01/17 155 lb (70.3 kg)     GEN: Patient is in no acute distress HEENT: Normal NECK: No JVD; No carotid bruits LYMPHATICS: No lymphadenopathy CARDIAC: S1 S2 regular, 2/6 systolic murmur at the apex. RESPIRATORY:  Clear to auscultation without rales, wheezing or rhonchi  ABDOMEN: Soft, non-tender, non-distended MUSCULOSKELETAL:  No edema; No deformity  SKIN: Warm and dry NEUROLOGIC:  Alert and oriented x 3 PSYCHIATRIC:  Normal affect    Signed, Jenean Lindau, MD  10/23/2017 1:49 PM    [facility]

## 2017-10-24 ENCOUNTER — Encounter: Payer: Self-pay | Admitting: *Deleted

## 2017-10-24 LAB — TSH: TSH: 150.3 u[IU]/mL — ABNORMAL HIGH (ref 0.450–4.500)

## 2017-10-25 ENCOUNTER — Other Ambulatory Visit: Payer: Self-pay | Admitting: *Deleted

## 2017-10-25 DIAGNOSIS — R778 Other specified abnormalities of plasma proteins: Secondary | ICD-10-CM

## 2017-10-25 DIAGNOSIS — R079 Chest pain, unspecified: Secondary | ICD-10-CM

## 2017-10-25 DIAGNOSIS — R7989 Other specified abnormal findings of blood chemistry: Principal | ICD-10-CM

## 2017-10-30 DIAGNOSIS — R079 Chest pain, unspecified: Secondary | ICD-10-CM | POA: Diagnosis not present

## 2017-10-30 DIAGNOSIS — R748 Abnormal levels of other serum enzymes: Secondary | ICD-10-CM | POA: Diagnosis not present

## 2017-10-31 ENCOUNTER — Telehealth: Payer: Self-pay

## 2017-10-31 NOTE — Telephone Encounter (Signed)
Patient's daughter, Daisy Barajas, returned call to office about her recent labs. I advised that her mother's TSH was just over 150 and that a copy was sent to her PCP. I advised that she would need to reach out to her mother's PCP about her recent TSH. She did let me know that her mother had been off her thyroid medication, but had recently been restarted and her medication and that her PCP did not want to make adjustments for a few weeks. I let her know that due to the current results she still may want to reach out to them. She expressed understanding.

## 2017-11-20 DIAGNOSIS — E039 Hypothyroidism, unspecified: Secondary | ICD-10-CM | POA: Diagnosis not present

## 2017-11-20 DIAGNOSIS — I4891 Unspecified atrial fibrillation: Secondary | ICD-10-CM | POA: Diagnosis not present

## 2017-11-20 DIAGNOSIS — E785 Hyperlipidemia, unspecified: Secondary | ICD-10-CM | POA: Diagnosis not present

## 2017-11-20 DIAGNOSIS — I272 Pulmonary hypertension, unspecified: Secondary | ICD-10-CM | POA: Diagnosis not present

## 2017-11-20 DIAGNOSIS — I129 Hypertensive chronic kidney disease with stage 1 through stage 4 chronic kidney disease, or unspecified chronic kidney disease: Secondary | ICD-10-CM | POA: Diagnosis not present

## 2017-11-29 ENCOUNTER — Other Ambulatory Visit: Payer: Medicare Other

## 2018-01-24 DIAGNOSIS — R7303 Prediabetes: Secondary | ICD-10-CM | POA: Diagnosis not present

## 2018-01-24 DIAGNOSIS — Z139 Encounter for screening, unspecified: Secondary | ICD-10-CM | POA: Diagnosis not present

## 2018-01-24 DIAGNOSIS — Z79899 Other long term (current) drug therapy: Secondary | ICD-10-CM | POA: Diagnosis not present

## 2018-01-24 DIAGNOSIS — E039 Hypothyroidism, unspecified: Secondary | ICD-10-CM | POA: Diagnosis not present

## 2018-01-24 DIAGNOSIS — Z23 Encounter for immunization: Secondary | ICD-10-CM | POA: Diagnosis not present

## 2018-01-24 DIAGNOSIS — E785 Hyperlipidemia, unspecified: Secondary | ICD-10-CM | POA: Diagnosis not present

## 2018-01-24 DIAGNOSIS — I129 Hypertensive chronic kidney disease with stage 1 through stage 4 chronic kidney disease, or unspecified chronic kidney disease: Secondary | ICD-10-CM | POA: Diagnosis not present

## 2018-01-24 DIAGNOSIS — I4891 Unspecified atrial fibrillation: Secondary | ICD-10-CM | POA: Diagnosis not present

## 2018-01-24 DIAGNOSIS — K219 Gastro-esophageal reflux disease without esophagitis: Secondary | ICD-10-CM | POA: Diagnosis not present

## 2018-01-30 ENCOUNTER — Encounter: Payer: Self-pay | Admitting: Gastroenterology

## 2018-02-19 ENCOUNTER — Ambulatory Visit: Payer: Medicare Other | Admitting: Gastroenterology

## 2018-02-20 DIAGNOSIS — Z79899 Other long term (current) drug therapy: Secondary | ICD-10-CM | POA: Diagnosis not present

## 2018-02-20 DIAGNOSIS — R7989 Other specified abnormal findings of blood chemistry: Secondary | ICD-10-CM | POA: Diagnosis not present

## 2018-02-20 DIAGNOSIS — L409 Psoriasis, unspecified: Secondary | ICD-10-CM | POA: Diagnosis not present

## 2018-02-20 DIAGNOSIS — M797 Fibromyalgia: Secondary | ICD-10-CM | POA: Diagnosis not present

## 2018-02-20 DIAGNOSIS — L4059 Other psoriatic arthropathy: Secondary | ICD-10-CM | POA: Diagnosis not present

## 2018-03-06 ENCOUNTER — Ambulatory Visit: Payer: Medicare Other | Admitting: Gastroenterology

## 2018-04-10 ENCOUNTER — Ambulatory Visit: Payer: Medicare Other | Admitting: Neurology

## 2018-05-08 DIAGNOSIS — I4891 Unspecified atrial fibrillation: Secondary | ICD-10-CM | POA: Diagnosis not present

## 2018-05-08 DIAGNOSIS — E039 Hypothyroidism, unspecified: Secondary | ICD-10-CM | POA: Diagnosis not present

## 2018-05-08 DIAGNOSIS — E785 Hyperlipidemia, unspecified: Secondary | ICD-10-CM | POA: Diagnosis not present

## 2018-05-08 DIAGNOSIS — R7303 Prediabetes: Secondary | ICD-10-CM | POA: Diagnosis not present

## 2018-05-08 DIAGNOSIS — Z79899 Other long term (current) drug therapy: Secondary | ICD-10-CM | POA: Diagnosis not present

## 2018-05-08 DIAGNOSIS — I129 Hypertensive chronic kidney disease with stage 1 through stage 4 chronic kidney disease, or unspecified chronic kidney disease: Secondary | ICD-10-CM | POA: Diagnosis not present

## 2018-08-21 DIAGNOSIS — Z79899 Other long term (current) drug therapy: Secondary | ICD-10-CM | POA: Diagnosis not present

## 2018-08-21 DIAGNOSIS — L409 Psoriasis, unspecified: Secondary | ICD-10-CM | POA: Diagnosis not present

## 2018-08-21 DIAGNOSIS — M797 Fibromyalgia: Secondary | ICD-10-CM | POA: Diagnosis not present

## 2018-08-21 DIAGNOSIS — L4059 Other psoriatic arthropathy: Secondary | ICD-10-CM | POA: Diagnosis not present

## 2018-08-21 DIAGNOSIS — R7989 Other specified abnormal findings of blood chemistry: Secondary | ICD-10-CM | POA: Diagnosis not present

## 2018-08-28 ENCOUNTER — Other Ambulatory Visit: Payer: Self-pay | Admitting: Neurology

## 2018-09-03 ENCOUNTER — Other Ambulatory Visit: Payer: Self-pay | Admitting: Neurology

## 2018-09-06 DIAGNOSIS — E785 Hyperlipidemia, unspecified: Secondary | ICD-10-CM | POA: Diagnosis not present

## 2018-09-06 DIAGNOSIS — S0300XA Dislocation of jaw, unspecified side, initial encounter: Secondary | ICD-10-CM | POA: Diagnosis not present

## 2018-09-06 DIAGNOSIS — K5900 Constipation, unspecified: Secondary | ICD-10-CM | POA: Diagnosis not present

## 2018-09-06 DIAGNOSIS — Z1231 Encounter for screening mammogram for malignant neoplasm of breast: Secondary | ICD-10-CM | POA: Diagnosis not present

## 2018-09-06 DIAGNOSIS — Z0000 Encounter for general adult medical examination without abnormal findings: Secondary | ICD-10-CM | POA: Diagnosis not present

## 2018-09-06 DIAGNOSIS — Z9181 History of falling: Secondary | ICD-10-CM | POA: Diagnosis not present

## 2018-09-24 ENCOUNTER — Other Ambulatory Visit: Payer: Self-pay | Admitting: Neurology

## 2018-10-30 ENCOUNTER — Other Ambulatory Visit: Payer: Self-pay | Admitting: Neurology

## 2018-11-23 ENCOUNTER — Other Ambulatory Visit: Payer: Self-pay | Admitting: Neurology

## 2018-12-03 ENCOUNTER — Other Ambulatory Visit: Payer: Self-pay | Admitting: Neurology

## 2018-12-27 ENCOUNTER — Telehealth: Payer: Medicare Other | Admitting: Neurology

## 2019-01-04 ENCOUNTER — Other Ambulatory Visit: Payer: Self-pay

## 2019-01-04 ENCOUNTER — Telehealth (INDEPENDENT_AMBULATORY_CARE_PROVIDER_SITE_OTHER): Payer: Medicare Other | Admitting: Neurology

## 2019-01-04 ENCOUNTER — Encounter: Payer: Self-pay | Admitting: Neurology

## 2019-01-04 VITALS — Ht 64.0 in | Wt 172.0 lb

## 2019-01-04 DIAGNOSIS — F0281 Dementia in other diseases classified elsewhere with behavioral disturbance: Secondary | ICD-10-CM | POA: Diagnosis not present

## 2019-01-04 DIAGNOSIS — G301 Alzheimer's disease with late onset: Secondary | ICD-10-CM | POA: Diagnosis not present

## 2019-01-04 DIAGNOSIS — F0280 Dementia in other diseases classified elsewhere without behavioral disturbance: Secondary | ICD-10-CM

## 2019-01-07 MED ORDER — MEMANTINE HCL ER 28 MG PO CP24: ORAL_CAPSULE | ORAL | 3 refills | Status: DC

## 2019-01-07 MED ORDER — GALANTAMINE HYDROBROMIDE ER 8 MG PO CP24: 24.0000 mg | ORAL_CAPSULE | Freq: Every day | ORAL | 3 refills | Status: DC

## 2019-01-07 NOTE — Progress Notes (Signed)
Virtual Visit via Video Note The purpose of this virtual visit is to provide medical care while limiting exposure to the novel coronavirus.    Consent was obtained for video visit:  Yes.   Answered questions that patient had about telehealth interaction:  Yes.   I discussed the limitations, risks, security and privacy concerns of performing an evaluation and management service by telemedicine. I also discussed with the patient that there may be a patient responsible charge related to this service. The patient expressed understanding and agreed to proceed.  Pt location: Home Physician Location: office Name of referring provider:  Renaldo Reel, PA I connected with Daisy Barajas at patients initiation/request on 01/04/2019 at 11:30 AM EDT by video enabled telemedicine application and verified that I am speaking with the correct person using two identifiers. Pt MRN:  [id number] Pt DOB:  11-30-1945 Video Participants:  Daisy Barajas;  Daisy Barajas (daughter)   History of Present Illness:  The patient was seen as a virtual video visit on 01/07/2019. She is accompanied by her daughter Daisy Barajas who helps supplement the history today. She was last seen over a year ago for Alzheimer's disease. MMSE 22/30 in June 2019. She is on galantamine ER 8mg  3tab daily (24mg  total) and Namenda XR 28mg  daily without side effects. Since her last visit, her daughter reports that she had run out of [place] since June and a significant difference was noticed where she would forget conversations within a minute or two. She gets very confused when they try to leave the house. She lives with her daughter with 24/7 supervision. She is independent with dressing and bathing but does not want to take a shower, she usually has "bird baths" on the sink. She denies any headaches, dizziness, vision changes, focal numbness/tingling/weakness, no falls. Mood and sleep are good, no wandering behavior. No paranoia or hallucinations. She does a lot  of reading, watches TV.   History on Initial Assessment 09/01/2017: This is a pleasant 73 year old right-handed woman with a history of hyperlipidemia, hypothyroidism, Alzheimer's disease, presenting to establish care. She had been going to [facility] until their practice closed, records were reviewed. Symptoms started around 2014 and have progressively worsened over time. She moved in with her daughter in 2017 and stopped driving around that time as well. Her daughter administers medications and manages finances. She hand bathes in the sink daily, but after she broke her arm in December, Sunday was the first time she got in the shower. A neighbor washes her hair weekly. She is able to dress herself independently. She misplaces things frequently at home. She does not wander outside the house, but a couple of times she has gone to the porch saying she would feed the dog. She is awake until 3am, then sleeps until 11am. She has 24/7 supervision at home. Her daughter reports aggressiveness every 1-2 weeks, "nothing major," but to the point that she would try to hit family. These mostly occur when they are needing to go somewhere or when they ask her to take a shower. No paranoia or hallucinations. Last MMSE 20/30 in December 2017. She is taking Galantamine ER 8mg  3 tabs daily and Namenda XR 28mg  daily without side effects.   She denies any headaches, dizziness, diplopia, dysarthria/dysphagia, neck/back pain, focal numbness/tingling/weakness, anosmia, or tremors. She wears pads for urinary incontinence. She denies any falls.   Diagnostic Data: Per Dr. 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  Current Outpatient Medications on File Prior to Visit  Medication Sig Dispense Refill   apixaban (ELIQUIS) 2.5 MG TABS tablet Take 2.5 mg by mouth 2 (two) times daily.     Apremilast (OTEZLA) 30 MG TABS Take by mouth.     Ascorbic Acid (VITAMIN C)  100 MG tablet Take 100 mg by mouth daily.     cetirizine (ZYRTEC) 10 MG tablet Take 10 mg by mouth daily.     co-enzyme Q-10 30 MG capsule Take 30 mg by mouth daily.     diphenhydramine-acetaminophen (TYLENOL PM) 25-500 MG TABS tablet Take 1 tablet by mouth at bedtime as needed.     famotidine (PEPCID) 20 MG tablet Take 20 mg by mouth daily.     gabapentin (NEURONTIN) 300 MG capsule Take 300 mg by mouth 3 (three) times daily.     levothyroxine (SYNTHROID, LEVOTHROID) 88 MCG tablet Take 88 mcg by mouth daily before breakfast.     linaclotide (LINZESS) 72 MCG capsule Take 72 mcg by mouth daily before breakfast.     metoprolol tartrate (LOPRESSOR) 50 MG tablet Take 50 mg by mouth 2 (two) times daily.     Omega-3 Fatty Acids (FISH OIL) 1000 MG CAPS Take by mouth daily.     pravastatin (PRAVACHOL) 40 MG tablet Take 40 mg by mouth daily.     rOPINIRole (REQUIP) 0.5 MG tablet Take 0.5 mg by mouth at bedtime.     No current facility-administered medications on file prior to visit.      Observations/Objective:   Vitals:   01/04/19 0933  Weight: 172 lb (78 kg)  Height: 5\' 4"  (1.626 m)   GEN:  The patient appears stated age and is in NAD.  Neurological examination: Patient is awake, alert, oriented x 3. No aphasia or dysarthria. Intact fluency and comprehension. Remote and recent memory intact. Able to name and repeat. Cranial nerves: Extraocular movements intact with no nystagmus. No facial asymmetry. Motor: moves all extremities symmetrically, at least anti-gravity x 4. No incoordination on finger to nose testing. Gait: slow and cautious, no ataxi Montreal Cognitive Assessment  01/04/2019  Visuospatial/ Executive (0/5) 1  Naming (0/3) 1  Attention: Read list of digits (0/2) 2  Attention: Read list of letters (0/1) 0  Attention: Serial 7 subtraction starting at 100 (0/3) 1  Language: Repeat phrase (0/2) 1  Language : Fluency (0/1) 0  Abstraction (0/2) 0  Delayed Recall (0/5) 0    Orientation (0/6) 2  Total 8  Adjusted Score (based on education) 9    Assessment and Plan:   This is a pleasant 73 yo RH woman with a history of hyperlipidemia, hypothyroidism, with Alzheimer's dementia without behavioral changes. MOCA score today 9/30. She is on Galantamine ER 24mg  daily and had run out of Namenda XR 28mg  daily with noticeable worsening. Refills for both medications sent today. No significant sleep or behavioral issues. Continue 24/7 supervision, she does not drive. We again discussed the importance of control of vascular risk factors, physical exercise, and brain stimulation exercises. She will follow-up in 8 months and knows to call for any changes.   Follow Up Instructions:   -I discussed the assessment and treatment plan with the patient. The patient was provided an opportunity to ask questions and all were answered. The patient agreed with the plan and demonstrated an understanding of the instructions.   The patient was advised to call back or seek an in-person evaluation if the symptoms worsen or if the condition fails  to improve as anticipated.    Cameron Sprang, MD

## 2019-01-21 ENCOUNTER — Other Ambulatory Visit: Payer: Self-pay | Admitting: Cardiology

## 2019-01-21 DIAGNOSIS — R002 Palpitations: Secondary | ICD-10-CM

## 2019-01-21 DIAGNOSIS — R778 Other specified abnormalities of plasma proteins: Secondary | ICD-10-CM

## 2019-02-06 ENCOUNTER — Telehealth: Payer: Self-pay | Admitting: Cardiology

## 2019-02-06 NOTE — Telephone Encounter (Signed)
YOUR CARDIOLOGY TEAM HAS ARRANGED FOR AN E-VISIT FOR YOUR APPOINTMENT - PLEASE REVIEW IMPORTANT INFORMATION BELOW SEVERAL DAYS PRIOR TO YOUR APPOINTMENT  Due to the recent COVID-19 pandemic, we are transitioning in-person office visits to tele-medicine visits in an effort to decrease unnecessary exposure to our patients, their families, and staff. These visits are billed to your insurance just like a normal visit is. We also encourage you to sign up for MyChart if you have not already done so. You will need a smartphone if possible. For patients that do not have this, we can still complete the visit using a regular telephone but do prefer a smartphone to enable video when possible. You may have a family member that lives with you that can help. If possible, we also ask that you have a blood pressure cuff and scale at home to measure your blood pressure, heart rate and weight prior to your scheduled appointment. Patients with clinical needs that need an in-person evaluation and testing will still be able to come to the office if absolutely necessary. If you have any questions, feel free to call our office.     YOUR PROVIDER WILL BE USING THE FOLLOWING PLATFORM TO COMPLETE YOUR VISIT: Doxy  IF USING MYCHART - How to Download the MyChart App to Your SmartPhone   - If Apple, go to [employer] and type in MyChart in the search bar and download the app. If Android, ask patient to go to [employer] and type in [place] in the search bar and download the app. The app is free but as with any other app downloads, your phone may require you to verify saved payment information or Apple/Android password.  - You will need to then log into the app with your MyChart username and password, and select [facility] as your healthcare provider to link the account.  - When it is time for your visit, go to the MyChart app, find appointments, and click Begin Video Visit. Be sure to Select Allow for your device to access the  Microphone and Camera for your visit. You will then be connected, and your provider will be with you shortly.  **If you have any issues connecting or need assistance, please contact MyChart service desk [phone])**  **If using a computer, in order to ensure the best quality for your visit, you will need to use either of the following Internet Browsers: Insurance underwriter or Microsoft Edge**   IF USING DOXIMITY or [email or web address] - The staff will give you instructions on receiving your link to join the meeting the day of your visit.      2-3 DAYS BEFORE YOUR APPOINTMENT  You will receive a telephone call from one of our [facility] team members - your caller ID may say "Unknown caller." If this is a video visit, we will walk you through how to get the video launched on your phone. We will remind you check your blood pressure, heart rate and weight prior to your scheduled appointment. If you have an Apple Watch or Kardia, please upload any pertinent ECG strips the day before or morning of your appointment to [place]. Our staff will also make sure you have reviewed the consent and agree to move forward with your scheduled tele-health visit.     THE DAY OF YOUR APPOINTMENT  Approximately 15 minutes prior to your scheduled appointment, you will receive a telephone call from one of [facility] team - your caller ID may say "Unknown caller."  Our  staff will confirm medications, vital signs for the day and any symptoms you may be experiencing. Please have this information available prior to the time of visit start. It may also be helpful for you to have a pad of paper and pen handy for any instructions given during your visit. They will also walk you through joining the smartphone meeting if this is a video visit.    CONSENT FOR TELE-HEALTH VISIT - PLEASE REVIEW  I hereby voluntarily request, consent and authorize [facility] and its employed or contracted physicians, physician assistants, nurse  practitioners or other licensed health care professionals (the Practitioner), to provide me with telemedicine health care services (the Services") as deemed necessary by the treating Practitioner. I acknowledge and consent to receive the Services by the Practitioner via telemedicine. I understand that the telemedicine visit will involve communicating with the Practitioner through live audiovisual communication technology and the disclosure of certain medical information by electronic transmission. I acknowledge that I have been given the opportunity to request an in-person assessment or other available alternative prior to the telemedicine visit and am voluntarily participating in the telemedicine visit.  I understand that I have the right to withhold or withdraw my consent to the use of telemedicine in the course of my care at any time, without affecting my right to future care or treatment, and that the Practitioner or I may terminate the telemedicine visit at any time. I understand that I have the right to inspect all information obtained and/or recorded in the course of the telemedicine visit and may receive copies of available information for a reasonable fee.  I understand that some of the potential risks of receiving the Services via telemedicine include:   Delay or interruption in medical evaluation due to technological equipment failure or disruption;  Information transmitted may not be sufficient (e.g. poor resolution of images) to allow for appropriate medical decision making by the Practitioner; and/or   In rare instances, security protocols could fail, causing a breach of personal health information.  Furthermore, I acknowledge that it is my responsibility to provide information about my medical history, conditions and care that is complete and accurate to the best of my ability. I acknowledge that Practitioner's advice, recommendations, and/or decision may be based on factors not within  their control, such as incomplete or inaccurate data provided by me or distortions of diagnostic images or specimens that may result from electronic transmissions. I understand that the practice of medicine is not an exact science and that Practitioner makes no warranties or guarantees regarding treatment outcomes. I acknowledge that I will receive a copy of this consent concurrently upon execution via email to the email address I last provided but may also request a printed copy by calling the office of [facility].    I understand that my insurance will be billed for this visit.   I have read or had this consent read to me.  I understand the contents of this consent, which adequately explains the benefits and risks of the Services being provided via telemedicine.   I have been provided ample opportunity to ask questions regarding this consent and the Services and have had my questions answered to my satisfaction.  I give my informed consent for the services to be provided through the use of telemedicine in my medical care  By participating in this telemedicine visit I agree to the above.

## 2019-02-11 DIAGNOSIS — Z23 Encounter for immunization: Secondary | ICD-10-CM | POA: Diagnosis not present

## 2019-02-11 DIAGNOSIS — E785 Hyperlipidemia, unspecified: Secondary | ICD-10-CM | POA: Diagnosis not present

## 2019-02-11 DIAGNOSIS — I4891 Unspecified atrial fibrillation: Secondary | ICD-10-CM | POA: Diagnosis not present

## 2019-02-11 DIAGNOSIS — N183 Chronic kidney disease, stage 3 unspecified: Secondary | ICD-10-CM | POA: Diagnosis not present

## 2019-02-11 DIAGNOSIS — R7303 Prediabetes: Secondary | ICD-10-CM | POA: Diagnosis not present

## 2019-02-11 DIAGNOSIS — Z139 Encounter for screening, unspecified: Secondary | ICD-10-CM | POA: Diagnosis not present

## 2019-02-11 DIAGNOSIS — I129 Hypertensive chronic kidney disease with stage 1 through stage 4 chronic kidney disease, or unspecified chronic kidney disease: Secondary | ICD-10-CM | POA: Diagnosis not present

## 2019-02-11 DIAGNOSIS — E039 Hypothyroidism, unspecified: Secondary | ICD-10-CM | POA: Diagnosis not present

## 2019-02-14 ENCOUNTER — Telehealth (INDEPENDENT_AMBULATORY_CARE_PROVIDER_SITE_OTHER): Payer: Medicare Other | Admitting: Cardiology

## 2019-02-14 ENCOUNTER — Encounter: Payer: Self-pay | Admitting: Cardiology

## 2019-02-14 VITALS — BP 125/81 | HR 59 | Ht 62.0 in | Wt 162.0 lb

## 2019-02-14 DIAGNOSIS — I10 Essential (primary) hypertension: Secondary | ICD-10-CM

## 2019-02-14 DIAGNOSIS — Z79899 Other long term (current) drug therapy: Secondary | ICD-10-CM | POA: Diagnosis not present

## 2019-02-14 DIAGNOSIS — R7989 Other specified abnormal findings of blood chemistry: Secondary | ICD-10-CM | POA: Diagnosis not present

## 2019-02-14 DIAGNOSIS — E7800 Pure hypercholesterolemia, unspecified: Secondary | ICD-10-CM | POA: Diagnosis not present

## 2019-02-14 DIAGNOSIS — M797 Fibromyalgia: Secondary | ICD-10-CM | POA: Diagnosis not present

## 2019-02-14 DIAGNOSIS — L4059 Other psoriatic arthropathy: Secondary | ICD-10-CM | POA: Diagnosis not present

## 2019-02-14 DIAGNOSIS — L409 Psoriasis, unspecified: Secondary | ICD-10-CM | POA: Diagnosis not present

## 2019-02-14 NOTE — Progress Notes (Signed)
Virtual Visit via Video Note   This visit type was conducted due to national recommendations for restrictions regarding the COVID-19 Pandemic (e.g. social distancing) in an effort to limit this patient's exposure and mitigate transmission in our community.  Due to her co-morbid illnesses, this patient is at least at moderate risk for complications without adequate follow up.  This format is felt to be most appropriate for this patient at this time.  All issues noted in this document were discussed and addressed.  A limited physical exam was performed with this format.  Please refer to the patient's chart for her consent to telehealth for [facility].   Date:  02/14/2019   ID:  Daisy Barajas, DOB [birth year], MRN [id number]  Patient Location: Home Provider Location: Office  PCP:  Renaldo Reel, PA  Cardiologist:  No primary care provider on file.  Electrophysiologist:  None   Evaluation Performed:  Follow-Up Visit  Chief Complaint: Irregular heartbeat  History of Present Illness:    Daisy Barajas is a 73 y.o. female with past medical history of irregular heartbeat, essential hypertension and dyslipidemia.  She denies any problems at this time and takes care of activities of daily living.  No chest pain orthopnea or PND.  The patient does not have symptoms concerning for COVID-19 infection (fever, chills, cough, or new shortness of breath).    Past Medical History:  Diagnosis Date  . Benign hypertension with CKD (chronic kidney disease) stage III   . Dementia ([place])   . Hyperlipidemia   . Hypothyroidism   . Overweight   . Prediabetes   . Reflux esophagitis    Past Surgical History:  Procedure Laterality Date  . WRIST FRACTURE SURGERY       Current Meds  Medication Sig  . apixaban (ELIQUIS) 2.5 MG TABS tablet Take 2.5 mg by mouth 2 (two) times daily.  Marland Kitchen Apremilast (OTEZLA) 30 MG TABS Take 30 mg by mouth 2 (two) times daily.   . Ascorbic Acid (VITAMIN C) 100 MG tablet Take 100  mg by mouth daily.  . cetirizine (ZYRTEC) 10 MG tablet Take 10 mg by mouth daily.  Marland Kitchen co-enzyme Q-10 30 MG capsule Take 30 mg by mouth daily.  . diphenhydramine-acetaminophen (TYLENOL PM) 25-500 MG TABS tablet Take 1 tablet by mouth at bedtime as needed.  . famotidine (PEPCID) 20 MG tablet Take 20 mg by mouth daily.  Marland Kitchen gabapentin (NEURONTIN) 300 MG capsule Take 300 mg by mouth 2 (two) times daily.   Marland Kitchen galantamine (RAZADYNE ER) 8 MG 24 hr capsule Take 3 capsules (24 mg total) by mouth daily.  Marland Kitchen levothyroxine (SYNTHROID, LEVOTHROID) 88 MCG tablet Take 88 mcg by mouth daily before breakfast.  . linaclotide (LINZESS) 72 MCG capsule Take 72 mcg by mouth daily before breakfast.  . memantine (NAMENDA XR) 28 MG CP24 24 hr capsule TAKE 1 CAPSULE(28 MG) BY MOUTH DAILY  . metoprolol tartrate (LOPRESSOR) 50 MG tablet Take 50 mg by mouth 2 (two) times daily.  . Omega-3 Fatty Acids (FISH OIL) 1000 MG CAPS Take by mouth daily.  . pravastatin (PRAVACHOL) 40 MG tablet Take 40 mg by mouth daily.  Marland Kitchen rOPINIRole (REQUIP) 0.5 MG tablet Take 0.5 mg by mouth at bedtime.     Allergies:   Bee venom and Iodinated diagnostic agents   Social History   Tobacco Use  . Smoking status: Never Smoker  . Smokeless tobacco: Never Used  Substance Use Topics  . Alcohol use: Not Currently  .  Drug use: Never     Family Hx: The patient's family history includes Alzheimer's disease in her mother; Dementia in her mother; Heart disease in her father.  ROS:   Please see the history of present illness.    As mentioned above All other systems reviewed and are negative.   Prior CV studies:   The following studies were reviewed today:  EKG available to me reveals sinus rhythm and nonspecific ST-T changes.  Labs/Other Tests and Data Reviewed:    EKG:  EKGs have revealed sinus arrhythmia  Recent Labs: No results found for requested labs within last 8760 hours.   Recent Lipid Panel No results found for: CHOL, TRIG, HDL,  CHOLHDL, LDLCALC, LDLDIRECT  Wt Readings from Last 3 Encounters:  02/14/19 162 lb (73.5 kg)  01/04/19 172 lb (78 kg)  10/23/17 159 lb 12.8 oz (72.5 kg)     Objective:    Vital Signs:  BP 125/81 (BP Location: Left Arm, Patient Position: Sitting, Cuff Size: Normal)   Pulse (!) 59   Ht 5\' 2"  (1.575 m)   Wt 162 lb (73.5 kg)   SpO2 96%   BMI 29.63 kg/m    VITAL SIGNS:  reviewed  ASSESSMENT & PLAN:    1. Irregular heartbeat: Patient was referred to me and the EKG done in the past revealed irregular heartbeat.  I was not convinced of any atrial fibrillation.  I had a lengthy conversation with her about it.  She is on anticoagulation.  I called her primary care provider's office and spoke to Dr. Delena Bali.  At the time of this dictation her provider Ihor Dow, physician assistant called me about this patient.  I had a lengthy discussion with her also.  She feels that the patient has record of atrial fibrillation documented and she is going to look it up and review it.  I told her that if it if she has any questions about EKG evaluations to get in touch with me.  I mentioned to her that for her to be on anticoagulation there has to be a documented episode of atrial fibrillation and she concurs.  We do make ourselves available should there be any questions about this aspect of her management. 2. Patient also has been told to be hyperlipidemic and pravastatin was considered to be increased.  I spoke to the patient's provider and give her a choice of atorvastatin or rosuvastatin to be initiated in lieu of pravastatin.  Again she will reviewed the chart and take care of lipid management. 3. Patient will be seen in follow-up appointment in 6 months or earlier if the patient has any concerns   COVID-19 Education: The signs and symptoms of COVID-19 were discussed with the patient and how to seek care for testing (follow up with PCP or arrange E-visit).  The importance of social distancing was discussed  today.  Time:   Today, I have spent 15 minutes with the patient with telehealth technology discussing the above problems.     Medication Adjustments/Labs and Tests Ordered: Current medicines are reviewed at length with the patient today.  Concerns regarding medicines are outlined above.   Tests Ordered: No orders of the defined types were placed in this encounter.   Medication Changes: No orders of the defined types were placed in this encounter.   Follow Up:  .RRR26m in 6 month(s)  Signed, Jenean Lindau, MD  02/14/2019 1:44 PM    [facility]

## 2019-02-14 NOTE — Patient Instructions (Signed)

## 2019-03-07 DIAGNOSIS — G309 Alzheimer's disease, unspecified: Secondary | ICD-10-CM | POA: Diagnosis not present

## 2019-03-07 DIAGNOSIS — I499 Cardiac arrhythmia, unspecified: Secondary | ICD-10-CM | POA: Diagnosis not present

## 2019-03-07 DIAGNOSIS — I700 Atherosclerosis of aorta: Secondary | ICD-10-CM | POA: Diagnosis not present

## 2019-03-13 DIAGNOSIS — I499 Cardiac arrhythmia, unspecified: Secondary | ICD-10-CM | POA: Diagnosis not present

## 2019-06-14 DIAGNOSIS — N183 Chronic kidney disease, stage 3 unspecified: Secondary | ICD-10-CM | POA: Diagnosis not present

## 2019-06-14 DIAGNOSIS — R7303 Prediabetes: Secondary | ICD-10-CM | POA: Diagnosis not present

## 2019-06-14 DIAGNOSIS — E039 Hypothyroidism, unspecified: Secondary | ICD-10-CM | POA: Diagnosis not present

## 2019-06-14 DIAGNOSIS — I129 Hypertensive chronic kidney disease with stage 1 through stage 4 chronic kidney disease, or unspecified chronic kidney disease: Secondary | ICD-10-CM | POA: Diagnosis not present

## 2019-06-14 DIAGNOSIS — I700 Atherosclerosis of aorta: Secondary | ICD-10-CM | POA: Diagnosis not present

## 2019-06-14 DIAGNOSIS — E785 Hyperlipidemia, unspecified: Secondary | ICD-10-CM | POA: Diagnosis not present

## 2019-07-12 DIAGNOSIS — E039 Hypothyroidism, unspecified: Secondary | ICD-10-CM | POA: Diagnosis not present

## 2019-08-14 DIAGNOSIS — L409 Psoriasis, unspecified: Secondary | ICD-10-CM | POA: Diagnosis not present

## 2019-08-14 DIAGNOSIS — M797 Fibromyalgia: Secondary | ICD-10-CM | POA: Diagnosis not present

## 2019-08-14 DIAGNOSIS — Z79899 Other long term (current) drug therapy: Secondary | ICD-10-CM | POA: Diagnosis not present

## 2019-08-14 DIAGNOSIS — R7989 Other specified abnormal findings of blood chemistry: Secondary | ICD-10-CM | POA: Diagnosis not present

## 2019-08-14 DIAGNOSIS — L4059 Other psoriatic arthropathy: Secondary | ICD-10-CM | POA: Diagnosis not present

## 2019-08-23 ENCOUNTER — Other Ambulatory Visit: Payer: Self-pay

## 2019-08-23 ENCOUNTER — Encounter: Payer: Self-pay | Admitting: Neurology

## 2019-08-23 ENCOUNTER — Telehealth (INDEPENDENT_AMBULATORY_CARE_PROVIDER_SITE_OTHER): Payer: Medicare Other | Admitting: Neurology

## 2019-08-23 VITALS — Ht 64.0 in | Wt 152.0 lb

## 2019-08-23 DIAGNOSIS — G301 Alzheimer's disease with late onset: Secondary | ICD-10-CM

## 2019-08-23 DIAGNOSIS — F0281 Dementia in other diseases classified elsewhere with behavioral disturbance: Secondary | ICD-10-CM

## 2019-08-23 DIAGNOSIS — F02818 Dementia in other diseases classified elsewhere, unspecified severity, with other behavioral disturbance: Secondary | ICD-10-CM

## 2019-08-23 MED ORDER — GALANTAMINE HYDROBROMIDE ER 8 MG PO CP24: 24.0000 mg | ORAL_CAPSULE | Freq: Every day | ORAL | 3 refills | Status: DC

## 2019-08-23 MED ORDER — DIVALPROEX SODIUM 125 MG PO CSDR: DELAYED_RELEASE_CAPSULE | ORAL | 11 refills | Status: DC

## 2019-08-23 MED ORDER — MEMANTINE HCL ER 28 MG PO CP24: ORAL_CAPSULE | ORAL | 3 refills | Status: DC

## 2019-08-23 NOTE — Progress Notes (Signed)
Virtual Visit via Video Note The purpose of this virtual visit is to provide medical care while limiting exposure to the novel coronavirus.    Consent was obtained for video visit:  Yes.   Answered questions that patient had about telehealth interaction:  Yes.   I discussed the limitations, risks, security and privacy concerns of performing an evaluation and management service by telemedicine. I also discussed with the patient that there may be a patient responsible charge related to this service. The patient expressed understanding and agreed to proceed.  Pt [place] Physician Location: office Name of referring provider:  Renaldo Reel, PA I connected with Daisy Barajas at patients initiation/request on 08/23/2019 at  3:00 PM EDT by video enabled telemedicine application and verified that I am speaking with the correct person using two identifiers. Pt MRN:  [id number] Pt DOB:  Apr 16, 1945 Video Participants:  Daisy Barajas;  Daisy Barajas (daughter)   History of Present Illness:  The patient had a virtual video visit on 08/23/2019. She was last seen in the neurology clinic 7 months ago for Alzheimer's disease with behavioral disturbance. Her daughter Daisy Barajas is present to provide additional information. She is taking galantamine ER 8mg  3 tabs daily (24mg  daily) and Namenda XR 28mg  daily without side effects. She reports her memory comes and goes. Daisy Barajas reports good and bad days. She gets really confused at some points, now having hallucinations that there are kids in the house. This usually occurs at night. Last night was the worst, they are visiting family a the beach and she thought she was supposed to be with someone else and asked how they took her. Mood is overall good, she does not get frustrated so easily. [name] manages finances, medications, meals. She does not drive. She is independent with dressing and bathing. She denies any headaches, dizziness, focal  numbness/tingling/weakness, no falls. Sleep and appetite are good. Daisy Barajas will be having surgery next month, family will be watching the patient.    History on Initial Assessment 09/01/2017: This is a pleasant 74 year old right-handed woman with a history of hyperlipidemia, hypothyroidism, Alzheimer's disease, presenting to establish care. She had been going to [facility] until their practice closed, records were reviewed. Symptoms started around 2014 and have progressively worsened over time. She moved in with her daughter in 2017 and stopped driving around that time as well. Her daughter administers medications and manages finances. She hand bathes in the sink daily, but after she broke her arm in December, Sunday was the first time she got in the shower. A neighbor washes her hair weekly. She is able to dress herself independently. She misplaces things frequently at home. She does not wander outside the house, but a couple of times she has gone to the porch saying she would feed the dog. She is awake until 3am, then sleeps until 11am. She has 24/7 supervision at home. Her daughter reports aggressiveness every 1-2 weeks, "nothing major," but to the point that she would try to hit family. These mostly occur when they are needing to go somewhere or when they ask her to take a shower. No paranoia or hallucinations. Last MMSE 20/30 in December 2017. She is taking Galantamine ER 8mg  3 tabs daily and Namenda XR 28mg  daily without side effects.   She denies any headaches, dizziness, diplopia, dysarthria/dysphagia, neck/back pain, focal numbness/tingling/weakness, anosmia, or tremors. She wears pads for urinary incontinence. She denies any falls.   Diagnostic Data: Per Dr.  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     Current Outpatient Medications on File Prior to Visit  Medication Sig Dispense Refill  . apixaban (ELIQUIS) 2.5 MG TABS tablet  Take 2.5 mg by mouth 2 (two) times daily.    Marland Kitchen Apremilast (OTEZLA) 30 MG TABS Take 30 mg by mouth 2 (two) times daily.     . Ascorbic Acid (VITAMIN C) 100 MG tablet Take 100 mg by mouth daily.    . cetirizine (ZYRTEC) 10 MG tablet Take 10 mg by mouth daily.    Marland Kitchen co-enzyme Q-10 30 MG capsule Take 30 mg by mouth daily.    . diphenhydramine-acetaminophen (TYLENOL PM) 25-500 MG TABS tablet Take 1 tablet by mouth at bedtime as needed.    . famotidine (PEPCID) 20 MG tablet Take 20 mg by mouth daily.    Marland Kitchen gabapentin (NEURONTIN) 300 MG capsule Take 300 mg by mouth 2 (two) times daily.     Marland Kitchen galantamine (RAZADYNE ER) 8 MG 24 hr capsule Take 3 capsules (24 mg total) by mouth daily. 270 capsule 3  . levothyroxine (SYNTHROID, LEVOTHROID) 88 MCG tablet Take 88 mcg by mouth daily before breakfast.    . linaclotide (LINZESS) 72 MCG capsule Take 72 mcg by mouth daily before breakfast.    . memantine (NAMENDA XR) 28 MG CP24 24 hr capsule TAKE 1 CAPSULE(28 MG) BY MOUTH DAILY 90 capsule 3  . metoprolol tartrate (LOPRESSOR) 50 MG tablet Take 50 mg by mouth 2 (two) times daily.    . Omega-3 Fatty Acids (FISH OIL) 1000 MG CAPS Take by mouth daily.    . pravastatin (PRAVACHOL) 40 MG tablet Take 40 mg by mouth daily.    Marland Kitchen rOPINIRole (REQUIP) 0.5 MG tablet Take 0.5 mg by mouth at bedtime.     No current facility-administered medications on file prior to visit.    Observations/Objective:   Vitals:   08/23/19 1427  Weight: 152 lb (68.9 kg)  Height: 5\' 4"  (1.626 m)   GEN:  The patient appears stated age and is in NAD.  Neurological examination: Patient is awake, alert, oriented x 1. No aphasia or dysarthria. Reduced fluency. Remote and recent memory impaired. Cranial nerves: Extraocular movements intact with no nystagmus. No facial asymmetry. Motor: moves all extremities symmetrically, at least anti-gravity x 4.    Assessment and Plan:   This is a pleasant 74 yo RH woman with a history of hyperlipidemia,  hypothyroidism, with Alzheimer's dementia now with more behavioral changes and hallucinations. We discussed adding on low dose Depakote 125mg  every evening, side effects discussed. Continue galantamine ER 24mg  daily and Namenda XR 28mg  daily. Continue 24/7 supervision. She does not drive. Follow-up in 8 months, they know to call for any changes.     Follow Up Instructions:   -I discussed the assessment and treatment plan with the patient. The patient was provided an opportunity to ask questions and all were answered. The patient agreed with the plan and demonstrated an understanding of the instructions.   The patient was advised to call back or seek an in-person evaluation if the symptoms worsen or if the condition fails to improve as anticipated.    Cameron Sprang, MD

## 2019-12-11 DIAGNOSIS — R05 Cough: Secondary | ICD-10-CM | POA: Diagnosis not present

## 2019-12-11 DIAGNOSIS — I129 Hypertensive chronic kidney disease with stage 1 through stage 4 chronic kidney disease, or unspecified chronic kidney disease: Secondary | ICD-10-CM | POA: Diagnosis not present

## 2019-12-11 DIAGNOSIS — N183 Chronic kidney disease, stage 3 unspecified: Secondary | ICD-10-CM | POA: Diagnosis not present

## 2019-12-11 DIAGNOSIS — E785 Hyperlipidemia, unspecified: Secondary | ICD-10-CM | POA: Diagnosis not present

## 2019-12-11 DIAGNOSIS — E039 Hypothyroidism, unspecified: Secondary | ICD-10-CM | POA: Diagnosis not present

## 2019-12-11 DIAGNOSIS — R7303 Prediabetes: Secondary | ICD-10-CM | POA: Diagnosis not present

## 2019-12-11 DIAGNOSIS — I700 Atherosclerosis of aorta: Secondary | ICD-10-CM | POA: Diagnosis not present

## 2020-01-13 DIAGNOSIS — E039 Hypothyroidism, unspecified: Secondary | ICD-10-CM | POA: Diagnosis not present

## 2020-02-12 ENCOUNTER — Other Ambulatory Visit: Payer: Self-pay | Admitting: Neurology

## 2020-04-03 DIAGNOSIS — E039 Hypothyroidism, unspecified: Secondary | ICD-10-CM | POA: Diagnosis not present

## 2020-04-06 DIAGNOSIS — Z139 Encounter for screening, unspecified: Secondary | ICD-10-CM | POA: Diagnosis not present

## 2020-04-06 DIAGNOSIS — E785 Hyperlipidemia, unspecified: Secondary | ICD-10-CM | POA: Diagnosis not present

## 2020-04-06 DIAGNOSIS — Z0000 Encounter for general adult medical examination without abnormal findings: Secondary | ICD-10-CM | POA: Diagnosis not present

## 2020-04-06 DIAGNOSIS — Z9181 History of falling: Secondary | ICD-10-CM | POA: Diagnosis not present

## 2020-04-16 DIAGNOSIS — I272 Pulmonary hypertension, unspecified: Secondary | ICD-10-CM | POA: Diagnosis not present

## 2020-04-16 DIAGNOSIS — N183 Chronic kidney disease, stage 3 unspecified: Secondary | ICD-10-CM | POA: Diagnosis not present

## 2020-04-16 DIAGNOSIS — R7989 Other specified abnormal findings of blood chemistry: Secondary | ICD-10-CM | POA: Diagnosis not present

## 2020-04-16 DIAGNOSIS — M797 Fibromyalgia: Secondary | ICD-10-CM | POA: Diagnosis not present

## 2020-04-16 DIAGNOSIS — K219 Gastro-esophageal reflux disease without esophagitis: Secondary | ICD-10-CM | POA: Diagnosis not present

## 2020-04-16 DIAGNOSIS — K5900 Constipation, unspecified: Secondary | ICD-10-CM | POA: Diagnosis not present

## 2020-04-16 DIAGNOSIS — I700 Atherosclerosis of aorta: Secondary | ICD-10-CM | POA: Diagnosis not present

## 2020-04-16 DIAGNOSIS — Z79899 Other long term (current) drug therapy: Secondary | ICD-10-CM | POA: Diagnosis not present

## 2020-04-16 DIAGNOSIS — R7303 Prediabetes: Secondary | ICD-10-CM | POA: Diagnosis not present

## 2020-04-16 DIAGNOSIS — L4059 Other psoriatic arthropathy: Secondary | ICD-10-CM | POA: Diagnosis not present

## 2020-04-16 DIAGNOSIS — G309 Alzheimer's disease, unspecified: Secondary | ICD-10-CM | POA: Diagnosis not present

## 2020-04-16 DIAGNOSIS — Z23 Encounter for immunization: Secondary | ICD-10-CM | POA: Diagnosis not present

## 2020-04-16 DIAGNOSIS — I129 Hypertensive chronic kidney disease with stage 1 through stage 4 chronic kidney disease, or unspecified chronic kidney disease: Secondary | ICD-10-CM | POA: Diagnosis not present

## 2020-04-16 DIAGNOSIS — E785 Hyperlipidemia, unspecified: Secondary | ICD-10-CM | POA: Diagnosis not present

## 2020-04-16 DIAGNOSIS — E039 Hypothyroidism, unspecified: Secondary | ICD-10-CM | POA: Diagnosis not present

## 2020-04-16 DIAGNOSIS — L409 Psoriasis, unspecified: Secondary | ICD-10-CM | POA: Diagnosis not present

## 2020-04-24 ENCOUNTER — Encounter: Payer: Self-pay | Admitting: Neurology

## 2020-04-24 ENCOUNTER — Other Ambulatory Visit: Payer: Self-pay

## 2020-04-24 ENCOUNTER — Telehealth (INDEPENDENT_AMBULATORY_CARE_PROVIDER_SITE_OTHER): Payer: Medicare Other | Admitting: Neurology

## 2020-04-24 VITALS — Ht 64.0 in | Wt 174.0 lb

## 2020-04-24 DIAGNOSIS — G301 Alzheimer's disease with late onset: Secondary | ICD-10-CM | POA: Diagnosis not present

## 2020-04-24 DIAGNOSIS — F02818 Dementia in other diseases classified elsewhere, unspecified severity, with other behavioral disturbance: Secondary | ICD-10-CM

## 2020-04-24 DIAGNOSIS — F0281 Dementia in other diseases classified elsewhere with behavioral disturbance: Secondary | ICD-10-CM

## 2020-04-24 NOTE — Progress Notes (Signed)
Telephone (Audio) Visit The purpose of this telephone visit is to provide medical care while limiting exposure to the novel coronavirus.    Consent was obtained for telephone visit:  Yes.   Answered questions that patient had about telehealth interaction:  Yes.   I discussed the limitations, risks, security and privacy concerns of performing an evaluation and management service by telephone. I also discussed with the patient that there may be a patient responsible charge related to this service. The patient expressed understanding and agreed to proceed.  Pt location: Private vehicle Physician Location: office Name of referring provider:  Renaldo Reel, PA I connected with .Daisy Barajas at patients initiation/request on 04/24/2020 at  3:00 PM EST by telephone and verified that I am speaking with the correct person using two identifiers.  Pt MRN:  [id number] Pt DOB:  11-Jan-1946   History of Present Illness:  The patient had a telephone visit on 04/24/2020. Unable to connect via video due to technical difficulties. She was last seen in the neurology clinic 8 months ago for Alzheimer's disease with behavioral disturbance. Her daughter [name] is again present to provide additional information. She is on Galantamine ER 8mg  3 tabs (24mg  daily) and Namenda XR 28mg  daily without side effects. ON her last visit, [name] was reporting sundowning with hallucinations in the evening hours. She was started on low dose Depakote 125mg  every evening, and has been doing really well per [name]. She states it is working really, really well, confusion is nowhere near as bad at night. She still has hallucinations every now and then in the evening hours, particularly thinking there are kids in the house. [name] manages medications, finances, meals. She is independent with dressing and bathing. Sleep is good. No paranoia, agitation, wandering behaviors. She loves to do Word [employer]. She denies any headaches, dizziness, focal  numbness/tingling/weakness. Sometimes her finger turn white and numb. No falls.    History on Initial Assessment 09/01/2017: This is a pleasant 75 year old right-handed woman with a history of hyperlipidemia, hypothyroidism, Alzheimer's disease, presenting to establish care. She had been going to [facility] until their practice closed, records were reviewed. Symptoms started around 2014 and have progressively worsened over time. She moved in with her daughter in 2017 and stopped driving around that time as well. Her daughter administers medications and manages finances. She hand bathes in the sink daily, but after she broke her arm in December, Sunday was the first time she got in the shower. A neighbor washes her hair weekly. She is able to dress herself independently. She misplaces things frequently at home. She does not wander outside the house, but a couple of times she has gone to the porch saying she would feed the dog. She is awake until 3am, then sleeps until 11am. She has 24/7 supervision at home. Her daughter reports aggressiveness every 1-2 weeks, "nothing major," but to the point that she would try to hit family. These mostly occur when they are needing to go somewhere or when they ask her to take a shower. No paranoia or hallucinations. Last MMSE 20/30 in December 2017. She is taking Galantamine ER 8mg  3 tabs daily and Namenda XR 28mg  daily without side effects.   She denies any headaches, dizziness, diplopia, dysarthria/dysphagia, neck/back pain, focal numbness/tingling/weakness, anosmia, or tremors. She wears pads for urinary incontinence. She denies any falls.   Diagnostic Data: Per Dr. 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  Current Outpatient Medications on File Prior to Visit  Medication Sig Dispense Refill  . Apremilast 30 MG TABS Take 30 mg by mouth 2 (two) times daily.     . Ascorbic Acid (VITAMIN C) 100 MG  tablet Take 100 mg by mouth daily.    Marland Kitchen aspirin EC 81 MG tablet Take 81 mg by mouth daily.    . cetirizine (ZYRTEC) 10 MG tablet Take 10 mg by mouth daily.    Marland Kitchen co-enzyme Q-10 30 MG capsule Take 30 mg by mouth daily.    . divalproex (DEPAKOTE SPRINKLE) 125 MG capsule Take 1 tablet every evening (around 5pm) 30 capsule 11  . famotidine (PEPCID) 20 MG tablet Take 20 mg by mouth daily.    Marland Kitchen gabapentin (NEURONTIN) 300 MG capsule Take 300 mg by mouth 2 (two) times daily.     Marland Kitchen galantamine (RAZADYNE ER) 8 MG 24 hr capsule TAKE 3 CAPSULES BY MOUTH EVERY DAY 270 capsule 0  . Levothyroxine Sodium 100 MCG CAPS Take 88 mcg by mouth daily before breakfast.    . linaclotide (LINZESS) 72 MCG capsule Take 72 mcg by mouth daily before breakfast.    . memantine (NAMENDA XR) 28 MG CP24 24 hr capsule TAKE 1 CAPSULE(28 MG) BY MOUTH DAILY 90 capsule 3  . metoprolol tartrate (LOPRESSOR) 50 MG tablet Take 50 mg by mouth 2 (two) times daily.    . Omega-3 Fatty Acids (FISH OIL) 1000 MG CAPS Take by mouth daily.    Marland Kitchen rOPINIRole (REQUIP) 0.5 MG tablet Take 0.5 mg by mouth at bedtime.    . rosuvastatin (CRESTOR) 20 MG tablet Take 20 mg by mouth at bedtime.    . diphenhydramine-acetaminophen (TYLENOL PM) 25-500 MG TABS tablet Take 1 tablet by mouth at bedtime as needed. (Patient not taking: Reported on 04/24/2020)     No current facility-administered medications on file prior to visit.       Observations/Objective:   Vitals:   04/24/20 1305  Weight: 174 lb (78.9 kg)  Height: 5\' 4"  (1.626 m)   Exam limited due to nature of phone visit. Patient is awake, alert, oriented x 1. Reduced fluency, remote and recent memory impaired. No dysarthria.   Assessment and Plan:   This is a pleasant 75 yo RH woman with a history of hyperlipidemia, hypothyroidism, with Alzheimer's dementia with behavioral changes. Hallucinations and sundowning improved some with Depakote 125mg  every evening. Continue Galantamine ER 24mg  daily and  Namenda XR 28mg  daily. Continue 24/7 care. Follow-up in 6-8 months, they know to call for any changes.    Follow Up Instructions:   -I discussed the assessment and treatment plan with the patient/daughter. The patient/daughter were provided an opportunity to ask questions and all were answered. The patient/daughter agreed with the plan and demonstrated an understanding of the instructions.   The patient/daughter were advised to call back or seek an in-person evaluation if the symptoms worsen or if the condition fails to improve as anticipated.    Total Time spent in visit with the patient was:  5:43 minutes, of which 100% of the time was spent in counseling and/or coordinating care on the above.   Pt understands and agrees with the plan of care outlined.     Cameron Sprang, MD

## 2020-05-06 MED ORDER — DIVALPROEX SODIUM 125 MG PO CSDR: DELAYED_RELEASE_CAPSULE | ORAL | 3 refills | Status: DC

## 2020-05-06 MED ORDER — MEMANTINE HCL ER 28 MG PO CP24: ORAL_CAPSULE | ORAL | 3 refills | Status: AC

## 2020-05-06 MED ORDER — GALANTAMINE HYDROBROMIDE ER 8 MG PO CP24: 24.0000 mg | ORAL_CAPSULE | Freq: Every day | ORAL | 3 refills | Status: AC

## 2020-05-27 DIAGNOSIS — J208 Acute bronchitis due to other specified organisms: Secondary | ICD-10-CM | POA: Diagnosis not present

## 2020-05-27 DIAGNOSIS — B9689 Other specified bacterial agents as the cause of diseases classified elsewhere: Secondary | ICD-10-CM | POA: Diagnosis not present

## 2020-06-29 DIAGNOSIS — R300 Dysuria: Secondary | ICD-10-CM | POA: Diagnosis not present

## 2020-06-29 DIAGNOSIS — N3001 Acute cystitis with hematuria: Secondary | ICD-10-CM | POA: Diagnosis not present

## 2020-07-28 DIAGNOSIS — M79604 Pain in right leg: Secondary | ICD-10-CM | POA: Diagnosis not present

## 2020-07-28 DIAGNOSIS — S76911A Strain of unspecified muscles, fascia and tendons at thigh level, right thigh, initial encounter: Secondary | ICD-10-CM | POA: Diagnosis not present

## 2020-08-23 ENCOUNTER — Other Ambulatory Visit: Payer: Self-pay | Admitting: Neurology

## 2020-09-23 ENCOUNTER — Other Ambulatory Visit: Payer: Self-pay | Admitting: Neurology

## 2020-09-23 DIAGNOSIS — I129 Hypertensive chronic kidney disease with stage 1 through stage 4 chronic kidney disease, or unspecified chronic kidney disease: Secondary | ICD-10-CM | POA: Diagnosis not present

## 2020-09-23 DIAGNOSIS — G309 Alzheimer's disease, unspecified: Secondary | ICD-10-CM | POA: Diagnosis not present

## 2020-09-23 DIAGNOSIS — R7303 Prediabetes: Secondary | ICD-10-CM | POA: Diagnosis not present

## 2020-09-23 DIAGNOSIS — E785 Hyperlipidemia, unspecified: Secondary | ICD-10-CM | POA: Diagnosis not present

## 2020-09-23 DIAGNOSIS — K219 Gastro-esophageal reflux disease without esophagitis: Secondary | ICD-10-CM | POA: Diagnosis not present

## 2020-09-23 DIAGNOSIS — K5900 Constipation, unspecified: Secondary | ICD-10-CM | POA: Diagnosis not present

## 2020-09-23 DIAGNOSIS — Z1152 Encounter for screening for COVID-19: Secondary | ICD-10-CM | POA: Diagnosis not present

## 2020-09-23 DIAGNOSIS — I700 Atherosclerosis of aorta: Secondary | ICD-10-CM | POA: Diagnosis not present

## 2020-09-23 DIAGNOSIS — N183 Chronic kidney disease, stage 3 unspecified: Secondary | ICD-10-CM | POA: Diagnosis not present

## 2020-09-23 DIAGNOSIS — I272 Pulmonary hypertension, unspecified: Secondary | ICD-10-CM | POA: Diagnosis not present

## 2020-09-23 DIAGNOSIS — E039 Hypothyroidism, unspecified: Secondary | ICD-10-CM | POA: Diagnosis not present

## 2020-10-05 NOTE — Progress Notes (Signed)
Assessment/Plan:    Late Onset Alzheimer's Disease with Behavioral Disturbance   75 year old woman with a diagnosis of late onset AD with behavioral disturbance, seen today in a 6 month follow up. She is on Galantamine ER 24mg  daily and Namenda XR 28mg  daily, tolerating well. She is also on Depakote 125 mg qhs for mood control.  She continues to have some episodes of agitation  after moving to a long term facility 10 days ago.    Recommendations:   Discussed safety both in and out of the home. Continue 24/7 care at [facility]  Discussed the importance of regular daily schedule  Continue to monitor mood with Depakote 125 mg qhs Start Sertraline 25 mg qhs for better sleep and agitation Naps should be scheduled and should be no longer than 60 minutes and should not occur after 2 PM. Continue Galantamine ER 24mg  daily and Namenda XR 28mg  daily. Side effects were discussed Follow up in  6  months.   Case discussed with Dr. Delice Lesch who agrees with the plan      Subjective:   ED visits since last seen: none  Hospital admissions: none  Daisy Barajas is a 75 y.o. female  with a history of hypertension, hyperlipidemia, hypothyroidism  and Alzheimer's dementia with behavioral changes, seen today in follow up for memory loss. She was last seen on 04/24/20. This patient is accompanied in the office by her daughter [name] who supplements the history.  Previous records as well as any outside records available were reviewed prior to todays visit.  Patient is currently on  Galantamine ER 24mg  daily and Namenda XR 28mg  daily. Patient is not aware of amy memory chances but [name] reports that "her memory is like a fish, 30 seconds and gone". She does not seem to remember who enters or leaves the examining room during doctors visits and this has been confirm during this appointment, when she did not recall nursing checking her blood pressure prior to coming into the exam room. She continues to ask same  questions and repeat same words or sentences. She continues to love [employer] activities.   Since her last visit, she moves to a long term living facility, [facility] in [place], as [name] has to take care of her own health issues and cannot dedicate 24/7 to her mother. She moved 10 days ago and shares the room with 2 other female residents, "it's an adjustment for her". She is not sleeping very well at night and has "days on and days off", with intermittent sundowning days (every other day). She takes Depakote 125 mg at 5 pm, but "only helps every other day" daughter says. She has periods of irritability but this is very sporadic. No depression is reported, When she sleeps well, she can do so all night but when sundowning, she hallucinates, "sees her grandchildren in the room , and also looks for her mom and dad" . No sleepwalking or paranoia is reported.  Her appetite is good and denies trouble swallowing. She no longer cooks, manages finances or drives. Nursing manages the meds. She bathes with assistance, but has developed dislike in bathing and changing to new clothes. She is showing hoarding behavior and likes to wear same clothes or dress with the dirty clothes over the clean ones.  Denies living objects in unusual places. Denies headaches, falls, or injuries to the head, double vision, dizziness, focal numbness or tingling, unilateral weakness or tremors. Denies urine incontinence or retention. Denies constipation  or diarrhea.     History on Initial Assessment 09/01/2017: This is a pleasant 75 year old right-handed woman with a history of hyperlipidemia, hypothyroidism, Alzheimer's disease, presenting to establish care. She had been going to [facility] until their practice closed, records were reviewed. Symptoms started around 2014 and have progressively worsened over time. She moved in with her daughter in 2017 and stopped driving around that time as well. Her daughter administers  medications and manages finances. She hand bathes in the sink daily, but after she broke her arm in December, Sunday was the first time she got in the shower. A neighbor washes her hair weekly. She is able to dress herself independently. She misplaces things frequently at home. She does not wander outside the house, but a couple of times she has gone to the porch saying she would feed the dog. She is awake until 3am, then sleeps until 11am. She has 24/7 supervision at home. Her daughter reports aggressiveness every 1-2 weeks, "nothing major," but to the point that she would try to hit family. These mostly occur when they are needing to go somewhere or when they ask her to take a shower. No paranoia or hallucinations. Last MMSE 20/30 in December 2017. She is taking Galantamine ER 8mg  3 tabs daily and Namenda XR 28mg  daily without side effects.   She denies any headaches, dizziness, diplopia, dysarthria/dysphagia, neck/back pain, focal numbness/tingling/weakness, anosmia, or tremors. She wears pads for urinary incontinence. She denies any falls.   Diagnostic Data: Per Dr. 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 PREVIOUS MEDICATIONS:   CURRENT MEDICATIONS:  Outpatient Encounter Medications as of 10/06/2020  Medication Sig   Apremilast 30 MG TABS Take 30 mg by mouth 2 (two) times daily.    cetirizine (ZYRTEC) 10 MG tablet Take 10 mg by mouth daily.   divalproex (DEPAKOTE SPRINKLE) 125 MG capsule TAKE 1 CAPSULE BY MOUTH EVERY EVENING(AROUND 5 PM)   gabapentin (NEURONTIN) 300 MG capsule Take 300 mg by mouth 2 (two) times daily.    galantamine (RAZADYNE ER) 8 MG 24 hr capsule Take 3 capsules (24 mg total) by mouth daily.   levothyroxine (SYNTHROID) 88 MCG tablet Take 88 mcg by mouth daily.   linaclotide (LINZESS) 72 MCG capsule Take 72 mcg by mouth daily before breakfast.   memantine (NAMENDA XR) 28 MG CP24 24 hr capsule TAKE 1 CAPSULE(28 MG) BY  MOUTH DAILY   metoprolol tartrate (LOPRESSOR) 50 MG tablet Take 50 mg by mouth 2 (two) times daily.   rOPINIRole (REQUIP) 0.5 MG tablet Take 0.5 mg by mouth at bedtime.   rosuvastatin (CRESTOR) 20 MG tablet Take 20 mg by mouth at bedtime.   sertraline (ZOLOFT) 25 MG tablet Take 1 tablet (25 mg total) by mouth daily.   [DISCONTINUED] Levothyroxine Sodium 100 MCG CAPS Take 88 mcg by mouth daily before breakfast.   aspirin EC 81 MG tablet Take 81 mg by mouth daily. (Patient not taking: Reported on 10/06/2020)   [DISCONTINUED] Ascorbic Acid (VITAMIN C) 100 MG tablet Take 100 mg by mouth daily.   [DISCONTINUED] co-enzyme Q-10 30 MG capsule Take 30 mg by mouth daily.   [DISCONTINUED] diphenhydramine-acetaminophen (TYLENOL PM) 25-500 MG TABS tablet Take 1 tablet by mouth at bedtime as needed. (Patient not taking: Reported on 04/24/2020)   [DISCONTINUED] famotidine (PEPCID) 20 MG tablet Take 20 mg by mouth daily.   [DISCONTINUED] Omega-3 Fatty Acids (FISH OIL) 1000 MG CAPS Take by mouth  daily.   No facility-administered encounter medications on file as of 10/06/2020.     Objective:     PHYSICAL EXAMINATION:    VITALS:   Vitals:   10/06/20 1321  BP: (!) 149/77  Pulse: 74  SpO2: 95%  Weight: 162 lb 3.2 oz (73.6 kg)  Height: 5\' 5"  (1.651 m)    GEN:  The patient appears stated age and is in NAD. HEENT:  Normocephalic, atraumatic.   Neurological examination:  General: NAD, well-groomed, appears stated age. Orientation: The patient is alert. Oriented to person, not to place or date  Cranial nerves: There is good facial symmetry.The speech is not fluent but and clear. No aphasia or dysarthria. Fund of knowledge is educed . Recent and remote memory are impaired. Attention and concentration are reduced.  Unable to name objects and repeat phrases.  Hearing is intact to conversational tone.    Sensation: Sensation is intact to light touch throughout Motor: Strength is at least antigravity  x4.  Montreal Cognitive Assessment  01/04/2019  Visuospatial/ Executive (0/5) 1  Naming (0/3) 1  Attention: Read list of digits (0/2) 2  Attention: Read list of letters (0/1) 0  Attention: Serial 7 subtraction starting at 100 (0/3) 1  Language: Repeat phrase (0/2) 1  Language : Fluency (0/1) 0  Abstraction (0/2) 0  Delayed Recall (0/5) 0  Orientation (0/6) 2  Total 8  Adjusted Score (based on education) 9     MMSE - Mini Mental State Exam 09/01/2017  Orientation to time 1  Orientation to Place 4  Registration 3  Attention/ Calculation 5  Recall 0  Language- name 2 objects 2  Language- repeat 1  Language- follow 3 step command 3  Language- read & follow direction 1  Write a sentence 1  Copy design 1  Total score 22      Movement examination: Tone: There is normal tone in the UE/LE Abnormal movements:  no tremor.  No myoclonus.  No asterixis.   Coordination:  There is no decremation with RAM's. Normal finger to nose  Gait and Station: The patient has no difficulty arising out of a deep-seated chair without the use of the hands. The patient's stride length is good.  Gait is cautious and narrow.    CBC No flowsheet data found.   No flowsheet data found.     Total time spent on today's visit was 45 minutes, including both face-to-face time and nonface-to-face time. Time included that spent on review of records (prior notes available to me/labs/imaging if pertinent), discussing treatment and goals, answering patient's questions and coordinating care.  Cc:  Renaldo Reel, PA Sharene Butters, PA-C

## 2020-10-06 ENCOUNTER — Other Ambulatory Visit: Payer: Self-pay

## 2020-10-06 ENCOUNTER — Encounter: Payer: Self-pay | Admitting: Physician Assistant

## 2020-10-06 ENCOUNTER — Ambulatory Visit: Payer: Medicare Other | Admitting: Physician Assistant

## 2020-10-06 VITALS — BP 149/77 | HR 74 | Ht 65.0 in | Wt 162.2 lb

## 2020-10-06 DIAGNOSIS — F0281 Dementia in other diseases classified elsewhere with behavioral disturbance: Secondary | ICD-10-CM

## 2020-10-06 DIAGNOSIS — F02818 Dementia in other diseases classified elsewhere, unspecified severity, with other behavioral disturbance: Secondary | ICD-10-CM

## 2020-10-06 DIAGNOSIS — G301 Alzheimer's disease with late onset: Secondary | ICD-10-CM

## 2020-10-06 MED ORDER — SERTRALINE HCL 25 MG PO TABS: 25.0000 mg | ORAL_TABLET | Freq: Every day | ORAL | 3 refills | Status: DC

## 2020-10-06 NOTE — Patient Instructions (Addendum)
It was a pleasure to see you today at our office.   Recommendations:  Meds: Follow up in 6-8  months Continue Galantamine ER 24mg  daily and Namenda XR 28mg  daily. Continue Depakote 125 mg nightly Start Sertraline 25 mg daily for better sleep and agitation  RECOMMENDATIONS FOR ALL PATIENTS WITH MEMORY PROBLEMS: 1. Continue to exercise (Recommend 30 minutes of walking everyday, or 3 hours every week) 2. Increase social interactions - continue going to [place] and enjoy social gatherings with friends and family 3. Eat healthy, avoid fried foods and eat more fruits and vegetables 4. Maintain adequate blood pressure, blood sugar, and blood cholesterol level. Reducing the risk of stroke and cardiovascular disease also helps promoting better memory. 5. Avoid stressful situations. Live a simple life and avoid aggravations. Organize your time and prepare for the next day in anticipation. 6. Sleep well, avoid any interruptions of sleep and avoid any distractions in the bedroom that may interfere with adequate sleep quality 7. Avoid sugar, avoid sweets as there is a strong link between excessive sugar intake, diabetes, and cognitive impairment We discussed the Mediterranean diet, which has been shown to help patients reduce the risk of progressive memory disorders and reduces cardiovascular risk. This includes eating fish, eat fruits and green leafy vegetables, nuts like almonds and hazelnuts, walnuts, and also use olive oil. Avoid fast foods and fried foods as much as possible. Avoid sweets and sugar as sugar use has been linked to worsening of memory function.  There is always a concern of gradual progression of memory problems. If this is the case, then we may need to adjust level of care according to patient needs. Support, both to the patient and caregiver, should then be put into place.    The Alzheimer's Association is here all day, every day for people facing Alzheimer's disease through our free  24/7 Helpline: (213)862-2681. The Helpline provides reliable information and support to all those who need assistance, such as individuals living with memory loss, Alzheimer's or other dementia, caregivers, health care professionals and the public.  Our highly trained and knowledgeable staff can help you with: Understanding memory loss, dementia and Alzheimer's  Medications and other treatment options  General information about aging and brain health  Skills to provide quality care and to find the best care from professionals  Legal, financial and living-arrangement decisions Our Helpline also features: Confidential care consultation provided by master's level clinicians who can help with decision-making support, crisis assistance and education on issues families face every day  Help in a caller's preferred language using our translation service that features more than 200 languages and dialects  Referrals to local community programs, services and ongoing support     FALL PRECAUTIONS: Be cautious when walking. Scan the area for obstacles that may increase the risk of trips and falls. When getting up in the mornings, sit up at the edge of the bed for a few minutes before getting out of bed. Consider elevating the bed at the head end to avoid drop of blood pressure when getting up. Walk always in a well-lit room (use night lights in the walls). Avoid area rugs or power cords from appliances in the middle of the walkways. Use a walker or a cane if necessary and consider physical therapy for balance exercise. Get your eyesight checked regularly.    HOME SAFETY: Consider the safety of the kitchen when operating appliances like stoves, microwave oven, and blender. Consider having supervision and share cooking responsibilities until no  longer able to participate in those. Accidents with firearms and other hazards in the house should be identified and addressed as well.   ABILITY TO BE LEFT ALONE: If  patient is unable to contact 911 operator, consider using LifeLine, or when the need is there, arrange for someone to stay with patients. Smoking is a fire hazard, consider supervision or cessation. Risk of wandering should be assessed by caregiver and if detected at any point, supervision and safe proof recommendations should be instituted.  MEDICATION SUPERVISION: Inability to self-administer medication needs to be constantly addressed. Implement a mechanism to ensure safe administration of the medications.     Mediterranean Diet A Mediterranean diet refers to food and lifestyle choices that are based on the traditions of countries located on the [employer]. This way of eating has been shown to help prevent certain conditions and improve outcomes for people who have chronic diseases, like kidney disease and heart disease. What are tips for following this plan? Lifestyle  Cook and eat meals together with your family, when possible. Drink enough fluid to keep your urine clear or pale yellow. Be physically active every day. This includes: Aerobic exercise like running or swimming. Leisure activities like gardening, walking, or housework. Get 7-8 hours of sleep each night. If recommended by your health care provider, drink red wine in moderation. This means 1 glass a day for nonpregnant women and 2 glasses a day for men. A glass of wine equals 5 oz (150 mL). Reading food labels  Check the serving size of packaged foods. For foods such as rice and pasta, the serving size refers to the amount of cooked product, not dry. Check the total fat in packaged foods. Avoid foods that have saturated fat or trans fats. Check the ingredients list for added sugars, such as corn syrup. Shopping  At the grocery store, buy most of your food from the areas near the walls of the store. This includes: Fresh fruits and vegetables (produce). Grains, beans, nuts, and seeds. Some of these may be available in  unpackaged forms or large amounts (in bulk). Fresh seafood. Poultry and eggs. Low-fat dairy products. Buy whole ingredients instead of prepackaged foods. Buy fresh fruits and vegetables in-season from local farmers markets. Buy frozen fruits and vegetables in resealable bags. If you do not have access to quality fresh seafood, buy precooked frozen shrimp or canned fish, such as tuna, salmon, or sardines. Buy small amounts of raw or cooked vegetables, salads, or olives from the deli or salad bar at your store. Stock your pantry so you always have certain foods on hand, such as olive oil, canned tuna, canned tomatoes, rice, pasta, and beans. Cooking  Cook foods with extra-virgin olive oil instead of using butter or other vegetable oils. Have meat as a side dish, and have vegetables or grains as your main dish. This means having meat in small portions or adding small amounts of meat to foods like pasta or stew. Use beans or vegetables instead of meat in common dishes like chili or lasagna. Experiment with different cooking methods. Try roasting or broiling vegetables instead of steaming or sauteing them. Add frozen vegetables to soups, stews, pasta, or rice. Add nuts or seeds for added healthy fat at each meal. You can add these to yogurt, salads, or vegetable dishes. Marinate fish or vegetables using olive oil, lemon juice, garlic, and fresh herbs. Meal planning  Plan to eat 1 vegetarian meal one day each week. Try to work up to  2 vegetarian meals, if possible. Eat seafood 2 or more times a week. Have healthy snacks readily available, such as: Vegetable sticks with hummus. Greek yogurt. Fruit and nut trail mix. Eat balanced meals throughout the week. This includes: Fruit: 2-3 servings a day Vegetables: 4-5 servings a day Low-fat dairy: 2 servings a day Fish, poultry, or lean meat: 1 serving a day Beans and legumes: 2 or more servings a week Nuts and seeds: 1-2 servings a day Whole  grains: 6-8 servings a day Extra-virgin olive oil: 3-4 servings a day Limit red meat and sweets to only a few servings a month What are my food choices? Mediterranean diet Recommended Grains: Whole-grain pasta. Brown rice. Bulgar wheat. Polenta. Couscous. Whole-wheat bread. [place]. Vegetables: Artichokes. Beets. Broccoli. Cabbage. Carrots. Eggplant. Green beans. Chard. Kale. Spinach. Onions. Leeks. Peas. Squash. Tomatoes. Peppers. Radishes. Fruits: Apples. Apricots. Avocado. Berries. Bananas. Cherries. Dates. Figs. Grapes. Lemons. Melon. Oranges. Peaches. Plums. Pomegranate. Meats and other protein foods: Beans. Almonds. Sunflower seeds. Pine nuts. Peanuts. [place]. Salmon. Scallops. Shrimp. [place]. Tilapia. Clams. Oysters. Eggs. Dairy: Low-fat milk. Cheese. Greek yogurt. Beverages: Water. Red wine. Herbal tea. Fats and oils: Extra virgin olive oil. Avocado oil. Grape seed oil. Sweets and desserts: [place] yogurt with honey. Baked apples. Poached pears. Trail mix. Seasoning and other foods: Basil. Cilantro. Coriander. Cumin. Mint. Parsley. Sage. Rosemary. Tarragon. Garlic. Oregano. Thyme. Pepper. Balsalmic vinegar. Tahini. Hummus. Tomato sauce. Olives. Mushrooms. Limit these Grains: Prepackaged pasta or rice dishes. Prepackaged cereal with added sugar. Vegetables: Deep fried potatoes (french fries). Fruits: Fruit canned in syrup. Meats and other protein foods: Beef. Pork. Lamb. Poultry with skin. Hot dogs. Berniece Salines. Dairy: Ice cream. Sour cream. Whole milk. Beverages: Juice. Sugar-sweetened soft drinks. Beer. Liquor and spirits. Fats and oils: Butter. Canola oil. Vegetable oil. Beef fat (tallow). Lard. Sweets and desserts: Cookies. Cakes. Pies. Candy. Seasoning and other foods: Mayonnaise. Premade sauces and marinades. The items listed may not be a complete list. Talk with your dietitian about what dietary choices are right for you. Summary The Mediterranean diet includes both food and  lifestyle choices. Eat a variety of fresh fruits and vegetables, beans, nuts, seeds, and whole grains. Limit the amount of red meat and sweets that you eat. Talk with your health care provider about whether it is safe for you to drink red wine in moderation. This means 1 glass a day for nonpregnant women and 2 glasses a day for men. A glass of wine equals 5 oz (150 mL). This information is not intended to replace advice given to you by your health care provider. Make sure you discuss any questions you have with your health care provider. Document Released: 11/05/2015 Document Revised: 12/08/2015 Document Reviewed: 11/05/2015 Elsevier Interactive Patient Education  2017 [employer].

## 2020-11-05 DIAGNOSIS — M7989 Other specified soft tissue disorders: Secondary | ICD-10-CM | POA: Diagnosis not present

## 2020-11-05 DIAGNOSIS — M79604 Pain in right leg: Secondary | ICD-10-CM | POA: Diagnosis not present

## 2020-11-05 DIAGNOSIS — M79661 Pain in right lower leg: Secondary | ICD-10-CM | POA: Diagnosis not present

## 2020-11-05 DIAGNOSIS — R946 Abnormal results of thyroid function studies: Secondary | ICD-10-CM | POA: Diagnosis not present

## 2020-11-05 DIAGNOSIS — I509 Heart failure, unspecified: Secondary | ICD-10-CM | POA: Diagnosis not present

## 2020-11-05 DIAGNOSIS — M79662 Pain in left lower leg: Secondary | ICD-10-CM | POA: Diagnosis not present

## 2020-11-05 DIAGNOSIS — R609 Edema, unspecified: Secondary | ICD-10-CM | POA: Diagnosis not present

## 2020-11-16 DIAGNOSIS — R0609 Other forms of dyspnea: Secondary | ICD-10-CM | POA: Diagnosis not present

## 2020-11-16 DIAGNOSIS — N183 Chronic kidney disease, stage 3 unspecified: Secondary | ICD-10-CM | POA: Diagnosis not present

## 2020-11-16 DIAGNOSIS — G319 Degenerative disease of nervous system, unspecified: Secondary | ICD-10-CM | POA: Diagnosis not present

## 2020-11-16 DIAGNOSIS — R609 Edema, unspecified: Secondary | ICD-10-CM | POA: Diagnosis not present

## 2020-11-16 DIAGNOSIS — Z7982 Long term (current) use of aspirin: Secondary | ICD-10-CM | POA: Diagnosis not present

## 2020-11-16 DIAGNOSIS — M1990 Unspecified osteoarthritis, unspecified site: Secondary | ICD-10-CM | POA: Diagnosis not present

## 2020-11-16 DIAGNOSIS — M79662 Pain in left lower leg: Secondary | ICD-10-CM | POA: Diagnosis not present

## 2020-11-16 DIAGNOSIS — I129 Hypertensive chronic kidney disease with stage 1 through stage 4 chronic kidney disease, or unspecified chronic kidney disease: Secondary | ICD-10-CM | POA: Diagnosis not present

## 2020-11-16 DIAGNOSIS — E785 Hyperlipidemia, unspecified: Secondary | ICD-10-CM | POA: Diagnosis not present

## 2020-11-16 DIAGNOSIS — Z91041 Radiographic dye allergy status: Secondary | ICD-10-CM | POA: Diagnosis not present

## 2020-11-16 DIAGNOSIS — G9389 Other specified disorders of brain: Secondary | ICD-10-CM | POA: Diagnosis not present

## 2020-11-16 DIAGNOSIS — R42 Dizziness and giddiness: Secondary | ICD-10-CM | POA: Diagnosis not present

## 2020-11-16 DIAGNOSIS — M79661 Pain in right lower leg: Secondary | ICD-10-CM | POA: Diagnosis not present

## 2020-11-16 DIAGNOSIS — M25512 Pain in left shoulder: Secondary | ICD-10-CM | POA: Diagnosis not present

## 2020-11-16 DIAGNOSIS — E039 Hypothyroidism, unspecified: Secondary | ICD-10-CM | POA: Diagnosis not present

## 2020-11-16 DIAGNOSIS — K219 Gastro-esophageal reflux disease without esophagitis: Secondary | ICD-10-CM | POA: Diagnosis not present

## 2020-11-16 DIAGNOSIS — R0602 Shortness of breath: Secondary | ICD-10-CM | POA: Diagnosis not present

## 2020-11-16 DIAGNOSIS — Z79899 Other long term (current) drug therapy: Secondary | ICD-10-CM | POA: Diagnosis not present

## 2020-11-17 DIAGNOSIS — R079 Chest pain, unspecified: Secondary | ICD-10-CM | POA: Diagnosis not present

## 2020-11-20 DIAGNOSIS — R079 Chest pain, unspecified: Secondary | ICD-10-CM | POA: Diagnosis not present

## 2020-11-26 DIAGNOSIS — Z79899 Other long term (current) drug therapy: Secondary | ICD-10-CM | POA: Diagnosis not present

## 2020-11-26 DIAGNOSIS — R5381 Other malaise: Secondary | ICD-10-CM | POA: Diagnosis not present

## 2020-11-26 DIAGNOSIS — Z7689 Persons encountering health services in other specified circumstances: Secondary | ICD-10-CM | POA: Diagnosis not present

## 2020-11-26 DIAGNOSIS — L409 Psoriasis, unspecified: Secondary | ICD-10-CM | POA: Diagnosis not present

## 2020-11-26 DIAGNOSIS — R0600 Dyspnea, unspecified: Secondary | ICD-10-CM | POA: Diagnosis not present

## 2020-11-26 DIAGNOSIS — R609 Edema, unspecified: Secondary | ICD-10-CM | POA: Diagnosis not present

## 2020-11-27 DIAGNOSIS — K219 Gastro-esophageal reflux disease without esophagitis: Secondary | ICD-10-CM | POA: Diagnosis not present

## 2020-11-27 DIAGNOSIS — I129 Hypertensive chronic kidney disease with stage 1 through stage 4 chronic kidney disease, or unspecified chronic kidney disease: Secondary | ICD-10-CM | POA: Diagnosis not present

## 2020-11-27 DIAGNOSIS — E559 Vitamin D deficiency, unspecified: Secondary | ICD-10-CM | POA: Diagnosis not present

## 2020-11-27 DIAGNOSIS — M25512 Pain in left shoulder: Secondary | ICD-10-CM | POA: Diagnosis not present

## 2020-11-27 DIAGNOSIS — E785 Hyperlipidemia, unspecified: Secondary | ICD-10-CM | POA: Diagnosis not present

## 2020-11-27 DIAGNOSIS — N183 Chronic kidney disease, stage 3 unspecified: Secondary | ICD-10-CM | POA: Diagnosis not present

## 2020-11-27 DIAGNOSIS — R42 Dizziness and giddiness: Secondary | ICD-10-CM | POA: Diagnosis not present

## 2020-11-27 DIAGNOSIS — L409 Psoriasis, unspecified: Secondary | ICD-10-CM | POA: Diagnosis not present

## 2020-11-27 DIAGNOSIS — G301 Alzheimer's disease with late onset: Secondary | ICD-10-CM | POA: Diagnosis not present

## 2020-12-02 DIAGNOSIS — R42 Dizziness and giddiness: Secondary | ICD-10-CM | POA: Diagnosis not present

## 2020-12-02 DIAGNOSIS — K219 Gastro-esophageal reflux disease without esophagitis: Secondary | ICD-10-CM | POA: Diagnosis not present

## 2020-12-02 DIAGNOSIS — N183 Chronic kidney disease, stage 3 unspecified: Secondary | ICD-10-CM | POA: Diagnosis not present

## 2020-12-02 DIAGNOSIS — I129 Hypertensive chronic kidney disease with stage 1 through stage 4 chronic kidney disease, or unspecified chronic kidney disease: Secondary | ICD-10-CM | POA: Diagnosis not present

## 2020-12-02 DIAGNOSIS — G301 Alzheimer's disease with late onset: Secondary | ICD-10-CM | POA: Diagnosis not present

## 2020-12-02 DIAGNOSIS — E785 Hyperlipidemia, unspecified: Secondary | ICD-10-CM | POA: Diagnosis not present

## 2020-12-02 DIAGNOSIS — M25512 Pain in left shoulder: Secondary | ICD-10-CM | POA: Diagnosis not present

## 2020-12-02 DIAGNOSIS — E559 Vitamin D deficiency, unspecified: Secondary | ICD-10-CM | POA: Diagnosis not present

## 2020-12-02 DIAGNOSIS — L409 Psoriasis, unspecified: Secondary | ICD-10-CM | POA: Diagnosis not present

## 2020-12-04 DIAGNOSIS — G301 Alzheimer's disease with late onset: Secondary | ICD-10-CM | POA: Diagnosis not present

## 2020-12-04 DIAGNOSIS — L409 Psoriasis, unspecified: Secondary | ICD-10-CM | POA: Diagnosis not present

## 2020-12-04 DIAGNOSIS — M25512 Pain in left shoulder: Secondary | ICD-10-CM | POA: Diagnosis not present

## 2020-12-04 DIAGNOSIS — I129 Hypertensive chronic kidney disease with stage 1 through stage 4 chronic kidney disease, or unspecified chronic kidney disease: Secondary | ICD-10-CM | POA: Diagnosis not present

## 2020-12-04 DIAGNOSIS — E559 Vitamin D deficiency, unspecified: Secondary | ICD-10-CM | POA: Diagnosis not present

## 2020-12-04 DIAGNOSIS — N183 Chronic kidney disease, stage 3 unspecified: Secondary | ICD-10-CM | POA: Diagnosis not present

## 2020-12-04 DIAGNOSIS — K219 Gastro-esophageal reflux disease without esophagitis: Secondary | ICD-10-CM | POA: Diagnosis not present

## 2020-12-04 DIAGNOSIS — E785 Hyperlipidemia, unspecified: Secondary | ICD-10-CM | POA: Diagnosis not present

## 2020-12-04 DIAGNOSIS — R42 Dizziness and giddiness: Secondary | ICD-10-CM | POA: Diagnosis not present

## 2020-12-07 DIAGNOSIS — G301 Alzheimer's disease with late onset: Secondary | ICD-10-CM | POA: Diagnosis not present

## 2020-12-07 DIAGNOSIS — N183 Chronic kidney disease, stage 3 unspecified: Secondary | ICD-10-CM | POA: Diagnosis not present

## 2020-12-07 DIAGNOSIS — K219 Gastro-esophageal reflux disease without esophagitis: Secondary | ICD-10-CM | POA: Diagnosis not present

## 2020-12-07 DIAGNOSIS — E559 Vitamin D deficiency, unspecified: Secondary | ICD-10-CM | POA: Diagnosis not present

## 2020-12-07 DIAGNOSIS — M25512 Pain in left shoulder: Secondary | ICD-10-CM | POA: Diagnosis not present

## 2020-12-07 DIAGNOSIS — L409 Psoriasis, unspecified: Secondary | ICD-10-CM | POA: Diagnosis not present

## 2020-12-07 DIAGNOSIS — E785 Hyperlipidemia, unspecified: Secondary | ICD-10-CM | POA: Diagnosis not present

## 2020-12-07 DIAGNOSIS — I129 Hypertensive chronic kidney disease with stage 1 through stage 4 chronic kidney disease, or unspecified chronic kidney disease: Secondary | ICD-10-CM | POA: Diagnosis not present

## 2020-12-07 DIAGNOSIS — R42 Dizziness and giddiness: Secondary | ICD-10-CM | POA: Diagnosis not present

## 2020-12-08 DIAGNOSIS — G301 Alzheimer's disease with late onset: Secondary | ICD-10-CM | POA: Diagnosis not present

## 2020-12-08 DIAGNOSIS — E559 Vitamin D deficiency, unspecified: Secondary | ICD-10-CM | POA: Diagnosis not present

## 2020-12-08 DIAGNOSIS — E785 Hyperlipidemia, unspecified: Secondary | ICD-10-CM | POA: Diagnosis not present

## 2020-12-08 DIAGNOSIS — M25512 Pain in left shoulder: Secondary | ICD-10-CM | POA: Diagnosis not present

## 2020-12-08 DIAGNOSIS — R42 Dizziness and giddiness: Secondary | ICD-10-CM | POA: Diagnosis not present

## 2020-12-08 DIAGNOSIS — K219 Gastro-esophageal reflux disease without esophagitis: Secondary | ICD-10-CM | POA: Diagnosis not present

## 2020-12-08 DIAGNOSIS — I129 Hypertensive chronic kidney disease with stage 1 through stage 4 chronic kidney disease, or unspecified chronic kidney disease: Secondary | ICD-10-CM | POA: Diagnosis not present

## 2020-12-08 DIAGNOSIS — N183 Chronic kidney disease, stage 3 unspecified: Secondary | ICD-10-CM | POA: Diagnosis not present

## 2020-12-08 DIAGNOSIS — L409 Psoriasis, unspecified: Secondary | ICD-10-CM | POA: Diagnosis not present

## 2020-12-15 DIAGNOSIS — R42 Dizziness and giddiness: Secondary | ICD-10-CM | POA: Diagnosis not present

## 2020-12-15 DIAGNOSIS — N183 Chronic kidney disease, stage 3 unspecified: Secondary | ICD-10-CM | POA: Diagnosis not present

## 2020-12-15 DIAGNOSIS — L409 Psoriasis, unspecified: Secondary | ICD-10-CM | POA: Diagnosis not present

## 2020-12-15 DIAGNOSIS — M25512 Pain in left shoulder: Secondary | ICD-10-CM | POA: Diagnosis not present

## 2020-12-15 DIAGNOSIS — G301 Alzheimer's disease with late onset: Secondary | ICD-10-CM | POA: Diagnosis not present

## 2020-12-15 DIAGNOSIS — E559 Vitamin D deficiency, unspecified: Secondary | ICD-10-CM | POA: Diagnosis not present

## 2020-12-15 DIAGNOSIS — E785 Hyperlipidemia, unspecified: Secondary | ICD-10-CM | POA: Diagnosis not present

## 2020-12-15 DIAGNOSIS — K219 Gastro-esophageal reflux disease without esophagitis: Secondary | ICD-10-CM | POA: Diagnosis not present

## 2020-12-15 DIAGNOSIS — I129 Hypertensive chronic kidney disease with stage 1 through stage 4 chronic kidney disease, or unspecified chronic kidney disease: Secondary | ICD-10-CM | POA: Diagnosis not present

## 2020-12-17 DIAGNOSIS — L409 Psoriasis, unspecified: Secondary | ICD-10-CM | POA: Diagnosis not present

## 2020-12-17 DIAGNOSIS — G301 Alzheimer's disease with late onset: Secondary | ICD-10-CM | POA: Diagnosis not present

## 2020-12-17 DIAGNOSIS — R42 Dizziness and giddiness: Secondary | ICD-10-CM | POA: Diagnosis not present

## 2020-12-17 DIAGNOSIS — I129 Hypertensive chronic kidney disease with stage 1 through stage 4 chronic kidney disease, or unspecified chronic kidney disease: Secondary | ICD-10-CM | POA: Diagnosis not present

## 2020-12-17 DIAGNOSIS — E559 Vitamin D deficiency, unspecified: Secondary | ICD-10-CM | POA: Diagnosis not present

## 2020-12-17 DIAGNOSIS — K219 Gastro-esophageal reflux disease without esophagitis: Secondary | ICD-10-CM | POA: Diagnosis not present

## 2020-12-17 DIAGNOSIS — E785 Hyperlipidemia, unspecified: Secondary | ICD-10-CM | POA: Diagnosis not present

## 2020-12-17 DIAGNOSIS — M25512 Pain in left shoulder: Secondary | ICD-10-CM | POA: Diagnosis not present

## 2020-12-17 DIAGNOSIS — N183 Chronic kidney disease, stage 3 unspecified: Secondary | ICD-10-CM | POA: Diagnosis not present

## 2020-12-22 DIAGNOSIS — G301 Alzheimer's disease with late onset: Secondary | ICD-10-CM | POA: Diagnosis not present

## 2020-12-22 DIAGNOSIS — N183 Chronic kidney disease, stage 3 unspecified: Secondary | ICD-10-CM | POA: Diagnosis not present

## 2020-12-22 DIAGNOSIS — E785 Hyperlipidemia, unspecified: Secondary | ICD-10-CM | POA: Diagnosis not present

## 2020-12-22 DIAGNOSIS — L409 Psoriasis, unspecified: Secondary | ICD-10-CM | POA: Diagnosis not present

## 2020-12-22 DIAGNOSIS — I129 Hypertensive chronic kidney disease with stage 1 through stage 4 chronic kidney disease, or unspecified chronic kidney disease: Secondary | ICD-10-CM | POA: Diagnosis not present

## 2020-12-22 DIAGNOSIS — E559 Vitamin D deficiency, unspecified: Secondary | ICD-10-CM | POA: Diagnosis not present

## 2020-12-22 DIAGNOSIS — M25512 Pain in left shoulder: Secondary | ICD-10-CM | POA: Diagnosis not present

## 2020-12-22 DIAGNOSIS — K219 Gastro-esophageal reflux disease without esophagitis: Secondary | ICD-10-CM | POA: Diagnosis not present

## 2020-12-22 DIAGNOSIS — R42 Dizziness and giddiness: Secondary | ICD-10-CM | POA: Diagnosis not present

## 2020-12-24 DIAGNOSIS — E785 Hyperlipidemia, unspecified: Secondary | ICD-10-CM | POA: Diagnosis not present

## 2020-12-24 DIAGNOSIS — M25512 Pain in left shoulder: Secondary | ICD-10-CM | POA: Diagnosis not present

## 2020-12-24 DIAGNOSIS — I129 Hypertensive chronic kidney disease with stage 1 through stage 4 chronic kidney disease, or unspecified chronic kidney disease: Secondary | ICD-10-CM | POA: Diagnosis not present

## 2020-12-24 DIAGNOSIS — G301 Alzheimer's disease with late onset: Secondary | ICD-10-CM | POA: Diagnosis not present

## 2020-12-24 DIAGNOSIS — R42 Dizziness and giddiness: Secondary | ICD-10-CM | POA: Diagnosis not present

## 2020-12-24 DIAGNOSIS — L409 Psoriasis, unspecified: Secondary | ICD-10-CM | POA: Diagnosis not present

## 2020-12-24 DIAGNOSIS — K219 Gastro-esophageal reflux disease without esophagitis: Secondary | ICD-10-CM | POA: Diagnosis not present

## 2020-12-24 DIAGNOSIS — E559 Vitamin D deficiency, unspecified: Secondary | ICD-10-CM | POA: Diagnosis not present

## 2020-12-24 DIAGNOSIS — N183 Chronic kidney disease, stage 3 unspecified: Secondary | ICD-10-CM | POA: Diagnosis not present

## 2020-12-30 DIAGNOSIS — E559 Vitamin D deficiency, unspecified: Secondary | ICD-10-CM | POA: Diagnosis not present

## 2020-12-30 DIAGNOSIS — G301 Alzheimer's disease with late onset: Secondary | ICD-10-CM | POA: Diagnosis not present

## 2020-12-30 DIAGNOSIS — M25512 Pain in left shoulder: Secondary | ICD-10-CM | POA: Diagnosis not present

## 2020-12-30 DIAGNOSIS — N183 Chronic kidney disease, stage 3 unspecified: Secondary | ICD-10-CM | POA: Diagnosis not present

## 2020-12-30 DIAGNOSIS — E785 Hyperlipidemia, unspecified: Secondary | ICD-10-CM | POA: Diagnosis not present

## 2020-12-30 DIAGNOSIS — R42 Dizziness and giddiness: Secondary | ICD-10-CM | POA: Diagnosis not present

## 2020-12-30 DIAGNOSIS — K219 Gastro-esophageal reflux disease without esophagitis: Secondary | ICD-10-CM | POA: Diagnosis not present

## 2020-12-30 DIAGNOSIS — I129 Hypertensive chronic kidney disease with stage 1 through stage 4 chronic kidney disease, or unspecified chronic kidney disease: Secondary | ICD-10-CM | POA: Diagnosis not present

## 2020-12-30 DIAGNOSIS — L409 Psoriasis, unspecified: Secondary | ICD-10-CM | POA: Diagnosis not present

## 2021-01-01 DIAGNOSIS — R600 Localized edema: Secondary | ICD-10-CM | POA: Diagnosis not present

## 2021-01-01 DIAGNOSIS — R42 Dizziness and giddiness: Secondary | ICD-10-CM | POA: Diagnosis not present

## 2021-01-01 DIAGNOSIS — L409 Psoriasis, unspecified: Secondary | ICD-10-CM | POA: Diagnosis not present

## 2021-01-01 DIAGNOSIS — R188 Other ascites: Secondary | ICD-10-CM | POA: Diagnosis not present

## 2021-01-01 DIAGNOSIS — G2581 Restless legs syndrome: Secondary | ICD-10-CM | POA: Diagnosis not present

## 2021-01-01 DIAGNOSIS — R0989 Other specified symptoms and signs involving the circulatory and respiratory systems: Secondary | ICD-10-CM | POA: Diagnosis not present

## 2021-01-01 DIAGNOSIS — I499 Cardiac arrhythmia, unspecified: Secondary | ICD-10-CM | POA: Diagnosis not present

## 2021-01-01 DIAGNOSIS — I129 Hypertensive chronic kidney disease with stage 1 through stage 4 chronic kidney disease, or unspecified chronic kidney disease: Secondary | ICD-10-CM | POA: Diagnosis not present

## 2021-01-01 DIAGNOSIS — N183 Chronic kidney disease, stage 3 unspecified: Secondary | ICD-10-CM | POA: Diagnosis not present

## 2021-01-01 DIAGNOSIS — E559 Vitamin D deficiency, unspecified: Secondary | ICD-10-CM | POA: Diagnosis not present

## 2021-01-01 DIAGNOSIS — I700 Atherosclerosis of aorta: Secondary | ICD-10-CM | POA: Diagnosis not present

## 2021-01-01 DIAGNOSIS — E039 Hypothyroidism, unspecified: Secondary | ICD-10-CM | POA: Diagnosis not present

## 2021-01-01 DIAGNOSIS — R0602 Shortness of breath: Secondary | ICD-10-CM | POA: Diagnosis not present

## 2021-01-01 DIAGNOSIS — E785 Hyperlipidemia, unspecified: Secondary | ICD-10-CM | POA: Diagnosis not present

## 2021-01-01 DIAGNOSIS — G301 Alzheimer's disease with late onset: Secondary | ICD-10-CM | POA: Diagnosis not present

## 2021-01-01 DIAGNOSIS — K219 Gastro-esophageal reflux disease without esophagitis: Secondary | ICD-10-CM | POA: Diagnosis not present

## 2021-01-01 DIAGNOSIS — I10 Essential (primary) hypertension: Secondary | ICD-10-CM | POA: Diagnosis not present

## 2021-01-01 DIAGNOSIS — M25512 Pain in left shoulder: Secondary | ICD-10-CM | POA: Diagnosis not present

## 2021-01-04 DIAGNOSIS — Z79899 Other long term (current) drug therapy: Secondary | ICD-10-CM | POA: Diagnosis not present

## 2021-01-04 DIAGNOSIS — E119 Type 2 diabetes mellitus without complications: Secondary | ICD-10-CM | POA: Diagnosis not present

## 2021-01-04 DIAGNOSIS — D518 Other vitamin B12 deficiency anemias: Secondary | ICD-10-CM | POA: Diagnosis not present

## 2021-01-04 DIAGNOSIS — E559 Vitamin D deficiency, unspecified: Secondary | ICD-10-CM | POA: Diagnosis not present

## 2021-01-04 DIAGNOSIS — E7849 Other hyperlipidemia: Secondary | ICD-10-CM | POA: Diagnosis not present

## 2021-01-05 DIAGNOSIS — I129 Hypertensive chronic kidney disease with stage 1 through stage 4 chronic kidney disease, or unspecified chronic kidney disease: Secondary | ICD-10-CM | POA: Diagnosis not present

## 2021-01-05 DIAGNOSIS — N183 Chronic kidney disease, stage 3 unspecified: Secondary | ICD-10-CM | POA: Diagnosis not present

## 2021-01-05 DIAGNOSIS — E559 Vitamin D deficiency, unspecified: Secondary | ICD-10-CM | POA: Diagnosis not present

## 2021-01-05 DIAGNOSIS — M25512 Pain in left shoulder: Secondary | ICD-10-CM | POA: Diagnosis not present

## 2021-01-05 DIAGNOSIS — R42 Dizziness and giddiness: Secondary | ICD-10-CM | POA: Diagnosis not present

## 2021-01-05 DIAGNOSIS — K219 Gastro-esophageal reflux disease without esophagitis: Secondary | ICD-10-CM | POA: Diagnosis not present

## 2021-01-05 DIAGNOSIS — L409 Psoriasis, unspecified: Secondary | ICD-10-CM | POA: Diagnosis not present

## 2021-01-05 DIAGNOSIS — E785 Hyperlipidemia, unspecified: Secondary | ICD-10-CM | POA: Diagnosis not present

## 2021-01-05 DIAGNOSIS — G301 Alzheimer's disease with late onset: Secondary | ICD-10-CM | POA: Diagnosis not present

## 2021-01-07 DIAGNOSIS — R188 Other ascites: Secondary | ICD-10-CM | POA: Diagnosis not present

## 2021-01-07 DIAGNOSIS — Z23 Encounter for immunization: Secondary | ICD-10-CM | POA: Diagnosis not present

## 2021-01-08 DIAGNOSIS — M25562 Pain in left knee: Secondary | ICD-10-CM | POA: Diagnosis not present

## 2021-01-08 DIAGNOSIS — M25561 Pain in right knee: Secondary | ICD-10-CM | POA: Diagnosis not present

## 2021-01-08 DIAGNOSIS — M25511 Pain in right shoulder: Secondary | ICD-10-CM | POA: Diagnosis not present

## 2021-01-08 DIAGNOSIS — M25552 Pain in left hip: Secondary | ICD-10-CM | POA: Diagnosis not present

## 2021-01-08 DIAGNOSIS — M542 Cervicalgia: Secondary | ICD-10-CM | POA: Diagnosis not present

## 2021-01-08 DIAGNOSIS — M25512 Pain in left shoulder: Secondary | ICD-10-CM | POA: Diagnosis not present

## 2021-01-08 DIAGNOSIS — M25551 Pain in right hip: Secondary | ICD-10-CM | POA: Diagnosis not present

## 2021-01-12 DIAGNOSIS — G301 Alzheimer's disease with late onset: Secondary | ICD-10-CM | POA: Diagnosis not present

## 2021-01-12 DIAGNOSIS — K219 Gastro-esophageal reflux disease without esophagitis: Secondary | ICD-10-CM | POA: Diagnosis not present

## 2021-01-12 DIAGNOSIS — L409 Psoriasis, unspecified: Secondary | ICD-10-CM | POA: Diagnosis not present

## 2021-01-12 DIAGNOSIS — E559 Vitamin D deficiency, unspecified: Secondary | ICD-10-CM | POA: Diagnosis not present

## 2021-01-12 DIAGNOSIS — I129 Hypertensive chronic kidney disease with stage 1 through stage 4 chronic kidney disease, or unspecified chronic kidney disease: Secondary | ICD-10-CM | POA: Diagnosis not present

## 2021-01-12 DIAGNOSIS — N183 Chronic kidney disease, stage 3 unspecified: Secondary | ICD-10-CM | POA: Diagnosis not present

## 2021-01-12 DIAGNOSIS — R42 Dizziness and giddiness: Secondary | ICD-10-CM | POA: Diagnosis not present

## 2021-01-12 DIAGNOSIS — E785 Hyperlipidemia, unspecified: Secondary | ICD-10-CM | POA: Diagnosis not present

## 2021-01-12 DIAGNOSIS — M25512 Pain in left shoulder: Secondary | ICD-10-CM | POA: Diagnosis not present

## 2021-01-13 ENCOUNTER — Ambulatory Visit: Payer: Medicare Other | Admitting: Neurology

## 2021-01-13 DIAGNOSIS — Z79899 Other long term (current) drug therapy: Secondary | ICD-10-CM | POA: Diagnosis not present

## 2021-01-13 DIAGNOSIS — E119 Type 2 diabetes mellitus without complications: Secondary | ICD-10-CM | POA: Diagnosis not present

## 2021-01-13 DIAGNOSIS — E7849 Other hyperlipidemia: Secondary | ICD-10-CM | POA: Diagnosis not present

## 2021-01-13 DIAGNOSIS — D518 Other vitamin B12 deficiency anemias: Secondary | ICD-10-CM | POA: Diagnosis not present

## 2021-01-20 DIAGNOSIS — Z79899 Other long term (current) drug therapy: Secondary | ICD-10-CM | POA: Diagnosis not present

## 2021-01-20 DIAGNOSIS — E7849 Other hyperlipidemia: Secondary | ICD-10-CM | POA: Diagnosis not present

## 2021-01-20 DIAGNOSIS — D518 Other vitamin B12 deficiency anemias: Secondary | ICD-10-CM | POA: Diagnosis not present

## 2021-01-20 DIAGNOSIS — E119 Type 2 diabetes mellitus without complications: Secondary | ICD-10-CM | POA: Diagnosis not present

## 2021-01-22 DIAGNOSIS — I700 Atherosclerosis of aorta: Secondary | ICD-10-CM | POA: Diagnosis not present

## 2021-01-22 DIAGNOSIS — N189 Chronic kidney disease, unspecified: Secondary | ICD-10-CM | POA: Diagnosis not present

## 2021-01-22 DIAGNOSIS — R5381 Other malaise: Secondary | ICD-10-CM | POA: Diagnosis not present

## 2021-01-22 DIAGNOSIS — K219 Gastro-esophageal reflux disease without esophagitis: Secondary | ICD-10-CM | POA: Diagnosis not present

## 2021-01-22 DIAGNOSIS — E785 Hyperlipidemia, unspecified: Secondary | ICD-10-CM | POA: Diagnosis not present

## 2021-01-22 DIAGNOSIS — G629 Polyneuropathy, unspecified: Secondary | ICD-10-CM | POA: Diagnosis not present

## 2021-01-22 DIAGNOSIS — I10 Essential (primary) hypertension: Secondary | ICD-10-CM | POA: Diagnosis not present

## 2021-01-22 DIAGNOSIS — E039 Hypothyroidism, unspecified: Secondary | ICD-10-CM | POA: Diagnosis not present

## 2021-01-22 DIAGNOSIS — R600 Localized edema: Secondary | ICD-10-CM | POA: Diagnosis not present

## 2021-01-25 ENCOUNTER — Other Ambulatory Visit: Payer: Self-pay | Admitting: Physician Assistant

## 2021-01-25 DIAGNOSIS — E785 Hyperlipidemia, unspecified: Secondary | ICD-10-CM | POA: Diagnosis not present

## 2021-01-25 DIAGNOSIS — E038 Other specified hypothyroidism: Secondary | ICD-10-CM | POA: Diagnosis not present

## 2021-01-25 DIAGNOSIS — D518 Other vitamin B12 deficiency anemias: Secondary | ICD-10-CM | POA: Diagnosis not present

## 2021-01-25 DIAGNOSIS — E039 Hypothyroidism, unspecified: Secondary | ICD-10-CM | POA: Diagnosis not present

## 2021-01-25 DIAGNOSIS — N189 Chronic kidney disease, unspecified: Secondary | ICD-10-CM | POA: Diagnosis not present

## 2021-01-25 DIAGNOSIS — E119 Type 2 diabetes mellitus without complications: Secondary | ICD-10-CM | POA: Diagnosis not present

## 2021-01-25 DIAGNOSIS — E559 Vitamin D deficiency, unspecified: Secondary | ICD-10-CM | POA: Diagnosis not present

## 2021-01-25 DIAGNOSIS — I10 Essential (primary) hypertension: Secondary | ICD-10-CM | POA: Diagnosis not present

## 2021-01-29 DIAGNOSIS — I10 Essential (primary) hypertension: Secondary | ICD-10-CM | POA: Diagnosis not present

## 2021-01-29 DIAGNOSIS — E119 Type 2 diabetes mellitus without complications: Secondary | ICD-10-CM | POA: Diagnosis not present

## 2021-01-29 DIAGNOSIS — E7849 Other hyperlipidemia: Secondary | ICD-10-CM | POA: Diagnosis not present

## 2021-01-29 DIAGNOSIS — K5900 Constipation, unspecified: Secondary | ICD-10-CM | POA: Diagnosis not present

## 2021-01-29 DIAGNOSIS — G8929 Other chronic pain: Secondary | ICD-10-CM | POA: Diagnosis not present

## 2021-01-29 DIAGNOSIS — Z79899 Other long term (current) drug therapy: Secondary | ICD-10-CM | POA: Diagnosis not present

## 2021-01-29 DIAGNOSIS — G629 Polyneuropathy, unspecified: Secondary | ICD-10-CM | POA: Diagnosis not present

## 2021-01-29 DIAGNOSIS — G2581 Restless legs syndrome: Secondary | ICD-10-CM | POA: Diagnosis not present

## 2021-01-29 DIAGNOSIS — D518 Other vitamin B12 deficiency anemias: Secondary | ICD-10-CM | POA: Diagnosis not present

## 2021-01-29 DIAGNOSIS — K219 Gastro-esophageal reflux disease without esophagitis: Secondary | ICD-10-CM | POA: Diagnosis not present

## 2021-01-29 DIAGNOSIS — I700 Atherosclerosis of aorta: Secondary | ICD-10-CM | POA: Diagnosis not present

## 2021-01-29 DIAGNOSIS — J302 Other seasonal allergic rhinitis: Secondary | ICD-10-CM | POA: Diagnosis not present

## 2021-01-29 DIAGNOSIS — L409 Psoriasis, unspecified: Secondary | ICD-10-CM | POA: Diagnosis not present

## 2021-01-29 DIAGNOSIS — E785 Hyperlipidemia, unspecified: Secondary | ICD-10-CM | POA: Diagnosis not present

## 2021-02-03 DIAGNOSIS — E039 Hypothyroidism, unspecified: Secondary | ICD-10-CM | POA: Diagnosis not present

## 2021-02-03 DIAGNOSIS — E038 Other specified hypothyroidism: Secondary | ICD-10-CM | POA: Diagnosis not present

## 2021-02-03 DIAGNOSIS — I10 Essential (primary) hypertension: Secondary | ICD-10-CM | POA: Diagnosis not present

## 2021-02-03 DIAGNOSIS — E119 Type 2 diabetes mellitus without complications: Secondary | ICD-10-CM | POA: Diagnosis not present

## 2021-02-03 DIAGNOSIS — E785 Hyperlipidemia, unspecified: Secondary | ICD-10-CM | POA: Diagnosis not present

## 2021-02-03 DIAGNOSIS — E559 Vitamin D deficiency, unspecified: Secondary | ICD-10-CM | POA: Diagnosis not present

## 2021-02-03 DIAGNOSIS — G309 Alzheimer's disease, unspecified: Secondary | ICD-10-CM | POA: Diagnosis not present

## 2021-02-03 DIAGNOSIS — N189 Chronic kidney disease, unspecified: Secondary | ICD-10-CM | POA: Diagnosis not present

## 2021-02-03 DIAGNOSIS — D518 Other vitamin B12 deficiency anemias: Secondary | ICD-10-CM | POA: Diagnosis not present

## 2021-02-24 DIAGNOSIS — I10 Essential (primary) hypertension: Secondary | ICD-10-CM | POA: Diagnosis not present

## 2021-02-26 DIAGNOSIS — L409 Psoriasis, unspecified: Secondary | ICD-10-CM | POA: Diagnosis not present

## 2021-02-26 DIAGNOSIS — R600 Localized edema: Secondary | ICD-10-CM | POA: Diagnosis not present

## 2021-02-26 DIAGNOSIS — G309 Alzheimer's disease, unspecified: Secondary | ICD-10-CM | POA: Diagnosis not present

## 2021-02-26 DIAGNOSIS — E785 Hyperlipidemia, unspecified: Secondary | ICD-10-CM | POA: Diagnosis not present

## 2021-02-26 DIAGNOSIS — R5381 Other malaise: Secondary | ICD-10-CM | POA: Diagnosis not present

## 2021-02-26 DIAGNOSIS — I10 Essential (primary) hypertension: Secondary | ICD-10-CM | POA: Diagnosis not present

## 2021-02-26 DIAGNOSIS — K219 Gastro-esophageal reflux disease without esophagitis: Secondary | ICD-10-CM | POA: Diagnosis not present

## 2021-02-26 DIAGNOSIS — I739 Peripheral vascular disease, unspecified: Secondary | ICD-10-CM | POA: Diagnosis not present

## 2021-03-09 ENCOUNTER — Telehealth: Payer: Self-pay | Admitting: Physician Assistant

## 2021-03-09 DIAGNOSIS — Z79899 Other long term (current) drug therapy: Secondary | ICD-10-CM | POA: Diagnosis not present

## 2021-03-09 DIAGNOSIS — E7849 Other hyperlipidemia: Secondary | ICD-10-CM | POA: Diagnosis not present

## 2021-03-09 DIAGNOSIS — E119 Type 2 diabetes mellitus without complications: Secondary | ICD-10-CM | POA: Diagnosis not present

## 2021-03-09 DIAGNOSIS — E039 Hypothyroidism, unspecified: Secondary | ICD-10-CM | POA: Diagnosis not present

## 2021-03-09 DIAGNOSIS — D518 Other vitamin B12 deficiency anemias: Secondary | ICD-10-CM | POA: Diagnosis not present

## 2021-03-09 DIAGNOSIS — E038 Other specified hypothyroidism: Secondary | ICD-10-CM | POA: Diagnosis not present

## 2021-03-09 NOTE — Telephone Encounter (Signed)
Daisy Barajas, Danilyn's daughter would like a call back to discuss her mothers medication. Her new PCP doc that sees, changed some of her med's, and added some. Since then her mother has become aggressive. She would ike to know if these new med's are causing this

## 2021-03-09 NOTE — Telephone Encounter (Signed)
Spoke with pt daughter she said that in July pt moved in [facility] in October the house Dr took over her medication and changed her medication they changed her Depakote time and she has had a change in her behavior. Pt daughter has a list of meds from the facility she will have them fax it to the office so we can see the changes

## 2021-03-11 NOTE — Telephone Encounter (Signed)
They are given it at 8pm and its to late she was use to talken at 5pm and had no problems and with acting out, pt daughter said the 8pm time is just to late for it to get into her system before they try to get her in the shower or do activities. We are still waiting for pt MAR from the [facility] living .

## 2021-03-12 NOTE — Telephone Encounter (Signed)
Spoke with Dr Delice Lesch waiting on [facility] to see all medications pt is taken and the times they are being taken,

## 2021-03-15 NOTE — Telephone Encounter (Signed)
MAR reviewed, Depakote is being given at [facility] fax order to give Depakote at 5pm. Thanks

## 2021-03-16 NOTE — Telephone Encounter (Signed)
Pt daughter called and informed we are going to fax order to [place];'s place to change time of depakote back to 5pm

## 2021-03-17 DIAGNOSIS — D225 Melanocytic nevi of trunk: Secondary | ICD-10-CM | POA: Diagnosis not present

## 2021-03-17 DIAGNOSIS — L400 Psoriasis vulgaris: Secondary | ICD-10-CM | POA: Diagnosis not present

## 2021-03-17 DIAGNOSIS — R531 Weakness: Secondary | ICD-10-CM | POA: Diagnosis not present

## 2021-03-17 DIAGNOSIS — D485 Neoplasm of uncertain behavior of skin: Secondary | ICD-10-CM | POA: Diagnosis not present

## 2021-03-17 DIAGNOSIS — L821 Other seborrheic keratosis: Secondary | ICD-10-CM | POA: Diagnosis not present

## 2021-03-19 DIAGNOSIS — E559 Vitamin D deficiency, unspecified: Secondary | ICD-10-CM | POA: Diagnosis not present

## 2021-03-19 DIAGNOSIS — N189 Chronic kidney disease, unspecified: Secondary | ICD-10-CM | POA: Diagnosis not present

## 2021-03-19 DIAGNOSIS — I10 Essential (primary) hypertension: Secondary | ICD-10-CM | POA: Diagnosis not present

## 2021-03-19 DIAGNOSIS — D518 Other vitamin B12 deficiency anemias: Secondary | ICD-10-CM | POA: Diagnosis not present

## 2021-03-19 DIAGNOSIS — E785 Hyperlipidemia, unspecified: Secondary | ICD-10-CM | POA: Diagnosis not present

## 2021-03-19 DIAGNOSIS — E119 Type 2 diabetes mellitus without complications: Secondary | ICD-10-CM | POA: Diagnosis not present

## 2021-03-19 DIAGNOSIS — E038 Other specified hypothyroidism: Secondary | ICD-10-CM | POA: Diagnosis not present

## 2021-03-19 DIAGNOSIS — G309 Alzheimer's disease, unspecified: Secondary | ICD-10-CM | POA: Diagnosis not present

## 2021-03-19 DIAGNOSIS — E039 Hypothyroidism, unspecified: Secondary | ICD-10-CM | POA: Diagnosis not present

## 2021-03-25 DIAGNOSIS — I10 Essential (primary) hypertension: Secondary | ICD-10-CM | POA: Diagnosis not present

## 2021-05-17 ENCOUNTER — Ambulatory Visit: Payer: Medicare Other | Admitting: Physician Assistant

## 2021-08-17 ENCOUNTER — Telehealth: Payer: Self-pay | Admitting: Cardiology

## 2021-08-17 NOTE — Telephone Encounter (Signed)
New Message:   .  This patient would like to switch from Dr Geraldo Pitter service to Dr Rockey Situ service. She is requesting this switch, because she have moved closer to the [place] office.

## 2021-11-16 ENCOUNTER — Encounter: Payer: Self-pay | Admitting: *Deleted

## 2021-11-23 NOTE — Progress Notes (Unsigned)
Cardiology Office Note  Date:  11/24/2021   ID:  Daisy Barajas, DOB 25-May-1945, MRN [id number]  PCP:  Renaldo Reel, PA   Chief Complaint  Patient presents with   Establish Care    Patient has a history of irregular heartbeat, essential hypertension and dyslipidemia. "Doing well." Medications reviewed by the medication list from [facility].     HPI:  Daisy Barajas is a 76 y.o. female with past medical history of  irregular heartbeat,  paroxysmal tachycardia hypertension  Dyslipidemia Dementia , since 2012  Lives at [place] [place] 03/2021 Who presents for new patient evaluation in the [place] office of her paroxysmal tachycardia, HTN  She presents today with her daughter Prior history of paroxysmal tachycardia last episode 3 years ago Has had 6 episodes estimated per the daughter Episodes described as rapid heart rate, presenting acutely requiring intervention, calls to EMTs Sometimes EMTs would give medication and symptoms would resolve by the time she arrived in the emergency room None recently Used to be on metoprolol tartrate 50 twice daily, this appears to have been held at [place], details unclear  Currently reports feeling well, denies shortness of breath or chest pain Notes reviewed from nursing facility, blood pressure heart rate relatively well controlled She denies any problems at this time and takes care of activities of daily living.  Has had weight loss it would appear over the past 2 years  Last seen by cardiology November 2020 by telemedicine No regular exercise or activity program  EKG personally reviewed by myself on todays visit NSR rate 67 bpm no ST or T wave changes  PMH:   has a past medical history of Benign hypertension with CKD (chronic kidney disease) stage III ([place]), Dementia ([place]), Hyperlipidemia, Hypothyroidism, Overweight, Prediabetes, and Reflux esophagitis.  PSH:    Past Surgical History:  Procedure Laterality Date   WRIST FRACTURE  SURGERY      Current Outpatient Medications  Medication Sig Dispense Refill   Apremilast 30 MG TABS Take 30 mg by mouth 2 (two) times daily.     aspirin EC 81 MG tablet Take 81 mg by mouth daily.     cetirizine (ZYRTEC) 10 MG tablet Take 10 mg by mouth daily.     divalproex (DEPAKOTE SPRINKLE) 125 MG capsule TAKE 1 CAPSULE BY MOUTH EVERY EVENING(AROUND 5 PM) 30 capsule 0   docusate sodium (COLACE) 100 MG capsule 1 capsule as needed Orally Once a day for 30 day(s)     fluticasone (FLONASE) 50 MCG/ACT nasal spray Place into both nostrils.     furosemide (LASIX) 20 MG tablet Take 20 mg by mouth daily.     gabapentin (NEURONTIN) 100 MG capsule Take 100 mg by mouth daily.     gabapentin (NEURONTIN) 300 MG capsule Take 300 mg by mouth at bedtime.     galantamine (RAZADYNE ER) 8 MG 24 hr capsule Take 3 capsules (24 mg total) by mouth daily. (Patient taking differently: Take 8 mg by mouth daily.) 270 capsule 3   hydrALAZINE (APRESOLINE) 10 MG tablet Take 10 mg by mouth 3 (three) times daily.     levothyroxine (SYNTHROID) 100 MCG tablet Take 100 mcg by mouth daily.     linaclotide (LINZESS) 72 MCG capsule Take 72 mcg by mouth daily before breakfast.     losartan (COZAAR) 50 MG tablet Take 50 mg by mouth daily.     memantine (NAMENDA XR) 28 MG CP24 24 hr capsule TAKE 1 CAPSULE(28 MG) BY MOUTH DAILY (Patient  taking differently: TAKE 1 CAPSULE(28 MG) BY MOUTH TWICE DAILY) 90 capsule 3   memantine (NAMENDA) 5 MG tablet Take 5 mg by mouth 2 (two) times daily.     rOPINIRole (REQUIP) 0.5 MG tablet Take 0.5 mg by mouth at bedtime.     sertraline (ZOLOFT) 25 MG tablet TAKE 1 TABLET(25 MG) BY MOUTH DAILY 30 tablet 0   traMADol (ULTRAM) 50 MG tablet Take 50 mg by mouth 2 (two) times daily as needed.     levothyroxine (SYNTHROID) 88 MCG tablet Take 88 mcg by mouth daily. (Patient not taking: Reported on 11/24/2021)     No current facility-administered medications for this visit.    Allergies:   Bee venom and  Iodinated contrast media   Social History:  The patient  reports that she has never smoked. She has never used smokeless tobacco. She reports that she does not currently use alcohol. She reports that she does not use drugs.   Family History:   family history includes Alzheimer's disease in her mother; Dementia in her mother; Heart disease in her father.    Review of Systems: Review of Systems  Unable to perform ROS: Dementia    PHYSICAL EXAM: VS:  BP (!) 140/70 (BP Location: Left Arm, Patient Position: Sitting, Cuff Size: Normal)   Pulse 67   Ht '5\' 4"'$  (1.626 m)   Wt 155 lb 8 oz (70.5 kg)   SpO2 93%   BMI 26.69 kg/m  , BMI Body mass index is 26.69 kg/m. GEN: Well nourished, well developed, in no acute distress HEENT: normal Neck: no JVD, carotid bruits, or masses Cardiac: RRR; no murmurs, rubs, or gallops,no edema  Respiratory:  clear to auscultation bilaterally, normal work of breathing GI: soft, nontender, nondistended, + BS MS: no deformity or atrophy Skin: warm and dry, no rash Neuro:  Strength and sensation are intact Psych: euthymic mood, full affect   Recent Labs: No results found for requested labs within last 365 days.    Lipid Panel No results found for: "CHOL", "HDL", "LDLCALC", "TRIG"    Wt Readings from Last 3 Encounters:  11/24/21 155 lb 8 oz (70.5 kg)  10/06/20 162 lb 3.2 oz (73.6 kg)  04/24/20 174 lb (78.9 kg)     ASSESSMENT AND PLAN:  Problem List Items Addressed This Visit       Cardiology Problems   Sinus arrhythmia - Primary   Relevant Medications   furosemide (LASIX) 20 MG tablet   hydrALAZINE (APRESOLINE) 10 MG tablet   losartan (COZAAR) 50 MG tablet   Hypercholesteremia   Relevant Medications   furosemide (LASIX) 20 MG tablet   hydrALAZINE (APRESOLINE) 10 MG tablet   losartan (COZAAR) 50 MG tablet   Hypertension   Relevant Medications   furosemide (LASIX) 20 MG tablet   hydrALAZINE (APRESOLINE) 10 MG tablet   losartan (COZAAR)  50 MG tablet   Paroxysmal tachycardia Likely had paroxysmal atrial tachycardia versus SVT though unable to exclude A-fib or flutter.  No prior EKGs available documenting tachycardia Last episode 3 years ago, 6 episodes total per the daughter Recommend we restart low-dose beta-blocker metoprolol succinate 25 daily Continue losartan She has hydralazine as needed for high blood pressure, Lasix daily  Essential hypertension On losartan, will add metoprolol succinate 25 daily She has hydralazine as needed for high pressures    Total encounter time more than 30 minutes  Greater than 50% was spent in counseling and coordination of care with the patient    Signed,  Esmond Plants, M.D., Ph.D. [place], [place]

## 2021-11-24 ENCOUNTER — Encounter: Payer: Self-pay | Admitting: Cardiovascular Disease

## 2021-11-24 ENCOUNTER — Ambulatory Visit: Payer: Medicare Other | Attending: Cardiovascular Disease | Admitting: Cardiovascular Disease

## 2021-11-24 VITALS — BP 140/70 | HR 67 | Ht 64.0 in | Wt 155.5 lb

## 2021-11-24 DIAGNOSIS — E7800 Pure hypercholesterolemia, unspecified: Secondary | ICD-10-CM | POA: Diagnosis not present

## 2021-11-24 DIAGNOSIS — I498 Other specified cardiac arrhythmias: Secondary | ICD-10-CM

## 2021-11-24 DIAGNOSIS — I10 Essential (primary) hypertension: Secondary | ICD-10-CM | POA: Diagnosis not present

## 2021-11-24 DIAGNOSIS — I479 Paroxysmal tachycardia, unspecified: Secondary | ICD-10-CM | POA: Diagnosis not present

## 2021-11-24 MED ORDER — METOPROLOL SUCCINATE ER 25 MG PO TB24: 25.0000 mg | ORAL_TABLET | Freq: Every day | ORAL | 11 refills | Status: AC

## 2021-11-24 NOTE — Patient Instructions (Signed)
Medication Instructions:  Please start metoprolol succinate 25 mg daily  If you need a refill on your cardiac medications before your next appointment, please call your pharmacy.   Lab work: No new labs needed  Testing/Procedures: No new testing needed  Follow-Up: At [facility], you and your health needs are our priority.  As part of our continuing mission to provide you with exceptional heart care, we have created designated Provider Care Teams.  These Care Teams include your primary Cardiologist (physician) and Advanced Practice Providers (APPs -  Physician Assistants and Nurse Practitioners) who all work together to provide you with the care you need, when you need it.  You will need a follow up appointment in 12 months  Providers on your designated Care Team:   Murray Hodgkins, NP Christell Faith, PA-C Cadence Kathlen Mody, [place]  COVID-19 Vaccine Information can be found at: [email or web address].uk For questions related to vaccine distribution or appointments, please email [email or web address] or call [phone].

## 2023-11-27 NOTE — Progress Notes (Deleted)
 Cardiology Office Note  Date:  11/27/2023   ID:  Daisy Barajas, DOB [birth year], MRN [id number]  PCP:  Barbarann Mallie DEL, PA   No chief complaint on file.   HPI:  Daisy Barajas is a 78 y.o. female with past medical history of  irregular heartbeat,  paroxysmal tachycardia hypertension  Dyslipidemia Dementia , since 2012  Lives at [facility] house 03/2021 Who presents for follow-up of her paroxysmal tachycardia, HTN  Last seen in clinic by myself August 2023   She presents today with her daughter Prior history of paroxysmal tachycardia last episode 3 years ago Has had 6 episodes estimated per the daughter Episodes described as rapid heart rate, presenting acutely requiring intervention, calls to EMTs Sometimes EMTs would give medication and symptoms would resolve by the time she arrived in the emergency room None recently Used to be on metoprolol  tartrate 50 twice daily, this appears to have been held at [facility], details unclear  Currently reports feeling well, denies shortness of breath or chest pain Notes reviewed from nursing facility, blood pressure heart rate relatively well controlled She denies any problems at this time and takes care of activities of daily living.  Has had weight loss it would appear over the past 2 years  Last seen by cardiology November 2020 by telemedicine No regular exercise or activity program  EKG personally reviewed by myself on todays visit NSR rate 67 bpm no ST or T wave changes  PMH:   has a past medical history of Benign hypertension with CKD (chronic kidney disease) stage III (HCC), Dementia (HCC), Hyperlipidemia, Hypothyroidism, Overweight, Prediabetes, and Reflux esophagitis.  PSH:    Past Surgical History:  Procedure Laterality Date   WRIST FRACTURE SURGERY      Current Outpatient Medications  Medication Sig Dispense Refill   Apremilast 30 MG TABS Take 30 mg by mouth 2 (two) times daily.     aspirin EC 81 MG tablet Take 81 mg by mouth  daily.     cetirizine (ZYRTEC) 10 MG tablet Take 10 mg by mouth daily.     divalproex  (DEPAKOTE  SPRINKLE) 125 MG capsule TAKE 1 CAPSULE BY MOUTH EVERY EVENING(AROUND 5 PM) 30 capsule 0   docusate sodium (COLACE) 100 MG capsule 1 capsule as needed Orally Once a day for 30 day(s)     fluticasone (FLONASE) 50 MCG/ACT nasal spray Place into both nostrils.     furosemide (LASIX) 20 MG tablet Take 20 mg by mouth daily.     gabapentin (NEURONTIN) 100 MG capsule Take 100 mg by mouth daily.     gabapentin (NEURONTIN) 300 MG capsule Take 300 mg by mouth at bedtime.     galantamine  (RAZADYNE  ER) 8 MG 24 hr capsule Take 3 capsules (24 mg total) by mouth daily. (Patient taking differently: Take 8 mg by mouth daily.) 270 capsule 3   hydrALAZINE  (APRESOLINE ) 10 MG tablet Take 10 mg by mouth 3 (three) times daily.     levothyroxine (SYNTHROID) 100 MCG tablet Take 100 mcg by mouth daily.     levothyroxine (SYNTHROID) 88 MCG tablet Take 88 mcg by mouth daily. (Patient not taking: Reported on 11/24/2021)     linaclotide (LINZESS) 72 MCG capsule Take 72 mcg by mouth daily before breakfast.     losartan (COZAAR) 50 MG tablet Take 50 mg by mouth daily.     memantine  (NAMENDA  XR) 28 MG CP24 24 hr capsule TAKE 1 CAPSULE(28 MG) BY MOUTH DAILY (Patient taking differently: TAKE 1 CAPSULE(28 MG) BY  MOUTH TWICE DAILY) 90 capsule 3   memantine  (NAMENDA ) 5 MG tablet Take 5 mg by mouth 2 (two) times daily.     metoprolol  succinate (TOPROL  XL) 25 MG 24 hr tablet Take 1 tablet (25 mg total) by mouth daily. 30 tablet 11   rOPINIRole (REQUIP) 0.5 MG tablet Take 0.5 mg by mouth at bedtime.     sertraline  (ZOLOFT ) 25 MG tablet TAKE 1 TABLET(25 MG) BY MOUTH DAILY 30 tablet 0   traMADol (ULTRAM) 50 MG tablet Take 50 mg by mouth 2 (two) times daily as needed.     No current facility-administered medications for this visit.    Allergies:   Bee venom and Iodinated contrast media   Social History:  The patient  reports that she has  never smoked. She has never used smokeless tobacco. She reports that she does not currently use alcohol. She reports that she does not use drugs.   Family History:   family history includes Alzheimer's disease in her mother; Dementia in her mother; Heart disease in her father.    Review of Systems: Review of Systems  Unable to perform ROS: Dementia    PHYSICAL EXAM: VS:  There were no vitals taken for this visit. , BMI There is no height or weight on file to calculate BMI. GEN: Well nourished, well developed, in no acute distress HEENT: normal Neck: no JVD, carotid bruits, or masses Cardiac: RRR; no murmurs, rubs, or gallops,no edema  Respiratory:  clear to auscultation bilaterally, normal work of breathing GI: soft, nontender, nondistended, + BS MS: no deformity or atrophy Skin: warm and dry, no rash Neuro:  Strength and sensation are intact Psych: euthymic mood, full affect   Recent Labs: No results found for requested labs within last 365 days.    Lipid Panel No results found for: CHOL, HDL, LDLCALC, TRIG    Wt Readings from Last 3 Encounters:  11/24/21 155 lb 8 oz (70.5 kg)  10/06/20 162 lb 3.2 oz (73.6 kg)  04/24/20 174 lb (78.9 kg)     ASSESSMENT AND PLAN:  Problem List Items Addressed This Visit   None   Paroxysmal tachycardia Likely had paroxysmal atrial tachycardia versus SVT though unable to exclude A-fib or flutter.  No prior EKGs available documenting tachycardia Last episode 3 years ago, 6 episodes total per the daughter Recommend we restart low-dose beta-blocker metoprolol  succinate 25 daily Continue losartan She has hydralazine  as needed for high blood pressure, Lasix daily  Essential hypertension On losartan, will add metoprolol  succinate 25 daily She has hydralazine  as needed for high pressures    Total encounter time more than 30 minutes  Greater than 50% was spent in counseling and coordination of care with the  patient    Signed, Velinda Lunger, M.D., Ph.D. [facility] [place], [place] [phone]

## 2023-11-28 ENCOUNTER — Ambulatory Visit: Admitting: Cardiovascular Disease

## 2023-11-28 DIAGNOSIS — R002 Palpitations: Secondary | ICD-10-CM

## 2023-11-28 DIAGNOSIS — E7800 Pure hypercholesterolemia, unspecified: Secondary | ICD-10-CM

## 2023-11-28 DIAGNOSIS — I498 Other specified cardiac arrhythmias: Secondary | ICD-10-CM

## 2023-11-28 DIAGNOSIS — I10 Essential (primary) hypertension: Secondary | ICD-10-CM

## 2024-01-28 NOTE — Progress Notes (Unsigned)
 Cardiology Office Note  Date:  01/29/2024   ID:  Daisy Barajas, DOB [birth year], MRN [id number]  PCP:  Barbarann Mallie DEL, PA   Chief Complaint  Patient presents with   12 month follow up     Patient denies chest pain or shortness of breath. Patient c/o bilateral LE edema.     HPI:  Daisy Barajas is a 78 y.o. female with past medical history of  irregular heartbeat,  paroxysmal tachycardia hypertension  Dyslipidemia Dementia , since 2012  Lives at [facility] house 03/2021 Who presents for new patient evaluation in the [place] office of her paroxysmal tachycardia, HTN  LOV 8/23 Followed by Randine Hacker at [facility] house  Presents today with staff from [facility] house On discussion today, now with lower extremity edema Amlodipine 10 mg daily appears new  Blood pressure appears relatively well-controlled Denies significant paroxysmal tachycardia Denies chest pain or shortness of breath  EKG personally reviewed by myself on todays visit EKG Interpretation Date/Time:  Monday January 29 2024 16:17:58 EST Ventricular Rate:  68 PR Interval:  164 QRS Duration:  72 QT Interval:  386 QTC Calculation: 410 R Axis:   32  Text Interpretation: Normal sinus rhythm Normal ECG No previous ECGs available Confirmed by Perla Lye [phone]) on 01/29/2024 4:40:44 PM   Other past medical history reviewed history of paroxysmal tachycardia last episode 3 years ago Has had 6 episodes estimated per the daughter Episodes described as rapid heart rate, presenting acutely requiring intervention, calls to EMTs Sometimes EMTs would give medication and symptoms would resolve by the time she arrived in the emergency room None recently Used to be on metoprolol  tartrate 50 twice daily, this appears to have been held at [facility], details unclear   PMH:   has a past medical history of Benign hypertension with CKD (chronic kidney disease) stage III (HCC), Dementia (HCC), Hyperlipidemia, Hypothyroidism,  Overweight, Prediabetes, and Reflux esophagitis.  PSH:    Past Surgical History:  Procedure Laterality Date   WRIST FRACTURE SURGERY      Current Outpatient Medications  Medication Sig Dispense Refill   acetaminophen (TYLENOL) 500 MG tablet Take 500 mg by mouth in the morning and at bedtime.     amLODipine (NORVASC) 10 MG tablet Take 10 mg by mouth daily.     Apremilast 30 MG TABS Take 30 mg by mouth 2 (two) times daily.     aspirin EC 81 MG tablet Take 81 mg by mouth daily.     betamethasone valerate ointment (VALISONE) 0.1 % Apply 1 Application topically 2 (two) times daily.     cetirizine (ZYRTEC) 10 MG tablet Take 10 mg by mouth daily.     divalproex  (DEPAKOTE  SPRINKLE) 125 MG capsule TAKE 1 CAPSULE BY MOUTH EVERY EVENING(AROUND 5 PM) 30 capsule 0   docusate sodium (COLACE) 100 MG capsule 1 capsule as needed Orally Once a day for 30 day(s)     Emollient (CERAVE MOISTURIZING) CREA Apply topically 2 (two) times a week.     fluticasone (FLONASE) 50 MCG/ACT nasal spray Place into both nostrils.     furosemide (LASIX) 20 MG tablet Take 20 mg by mouth daily.     galantamine  (RAZADYNE  ER) 8 MG 24 hr capsule Take 3 capsules (24 mg total) by mouth daily. (Patient taking differently: Take 8 mg by mouth daily.) 270 capsule 3   ketoconazole (NIZORAL) 2 % cream Apply 1 Application topically daily.     levothyroxine (SYNTHROID) 150 MCG tablet Take 150 mcg by mouth  daily.     linaclotide (LINZESS) 72 MCG capsule Take 72 mcg by mouth daily before breakfast.     losartan (COZAAR) 100 MG tablet Take 100 mg by mouth daily.     memantine  (NAMENDA ) 5 MG tablet Take 5 mg by mouth 2 (two) times daily.     metoprolol  succinate (TOPROL  XL) 25 MG 24 hr tablet Take 1 tablet (25 mg total) by mouth daily. 30 tablet 11   rOPINIRole (REQUIP) 0.5 MG tablet Take 0.5 mg by mouth at bedtime.     sertraline  (ZOLOFT ) 25 MG tablet TAKE 1 TABLET(25 MG) BY MOUTH DAILY 30 tablet 0   traMADol (ULTRAM) 50 MG tablet Take 50  mg by mouth 2 (two) times daily as needed.     memantine  (NAMENDA  XR) 28 MG CP24 24 hr capsule TAKE 1 CAPSULE(28 MG) BY MOUTH DAILY (Patient not taking: Reported on 01/29/2024) 90 capsule 3   No current facility-administered medications for this visit.    Allergies:   Bee venom, Honey bee venom, Iodinated contrast media, and Iodine   Social History:  The patient  reports that she has never smoked. She has never used smokeless tobacco. She reports that she does not currently use alcohol. She reports that she does not use drugs.   Family History:   family history includes Alzheimer's disease in her mother; Dementia in her mother; Heart disease in her father.    Review of Systems: Review of Systems  Unable to perform ROS: Dementia    PHYSICAL EXAM: VS:  BP 130/80 (BP Location: Left Arm, Patient Position: Sitting, Cuff Size: Normal)   Pulse 68   Ht 5' 4 (1.626 m)   Wt 162 lb 8 oz (73.7 kg)   SpO2 95%   BMI 27.89 kg/m  , BMI Body mass index is 27.89 kg/m. Constitutional:  oriented to person, place, and time. No distress.  HENT:  Head: Normocephalic and atraumatic.  Eyes:  no discharge. No scleral icterus.  Neck: Normal range of motion. Neck supple. No JVD present.  Cardiovascular: Normal rate, regular rhythm, normal heart sounds and intact distal pulses. Exam reveals no gallop and no friction rub. No edema No murmur heard. Pulmonary/Chest: Effort normal and breath sounds normal. No stridor. No respiratory distress.  no wheezes.  no rales.  no tenderness.  Abdominal: Soft.  no distension.  no tenderness.  Musculoskeletal: Normal range of motion.  no  tenderness or deformity.  Neurological:  normal muscle tone. Coordination normal. No atrophy Skin: Skin is warm and dry. No rash noted. not diaphoretic.  Psychiatric:  normal mood and affect. behavior is normal. Thought content normal.   Recent Labs: No results found for requested labs within last 365 days.    Lipid Panel No  results found for: CHOL, HDL, LDLCALC, TRIG    Wt Readings from Last 3 Encounters:  01/29/24 162 lb 8 oz (73.7 kg)  11/24/21 155 lb 8 oz (70.5 kg)  10/06/20 162 lb 3.2 oz (73.6 kg)     ASSESSMENT AND PLAN:  Problem List Items Addressed This Visit       Cardiology Problems   Sinus arrhythmia - Primary   Relevant Medications   amLODipine (NORVASC) 10 MG tablet   losartan (COZAAR) 100 MG tablet   Other Relevant Orders   EKG 12-Lead (Completed)   Hypercholesteremia   Relevant Medications   amLODipine (NORVASC) 10 MG tablet   losartan (COZAAR) 100 MG tablet   Hypertension   Relevant Medications   amLODipine (  NORVASC) 10 MG tablet   losartan (COZAAR) 100 MG tablet   Other Relevant Orders   EKG 12-Lead (Completed)     Other   Chest pain   Relevant Orders   EKG 12-Lead (Completed)    Paroxysmal tachycardia Suspected history of paroxysmal atrial tachycardia versus SVT though unable to exclude A-fib or flutter.  No recent episodes she is aware of -Seems to be relatively well-controlled on low-dose beta-blocker, metoprolol  succinate 25 daily  Essential hypertension Amlodipine likely contributing to worsening leg swelling Recommend she hold the amlodipine, continue losartan, metoprolol  succinate -We will add Cardura 1 mg twice daily in place of amlodipine -We will avoid diuretics given renal dysfunction creatinine 1.6 -My hope would be leg swelling would improve which might allow for slight reduction in her Lasix dosing     Signed, Velinda Lunger, M.D., Ph.D. [facility] [place], [place] [phone]

## 2024-01-29 ENCOUNTER — Encounter: Payer: Self-pay | Admitting: Cardiovascular Disease

## 2024-01-29 ENCOUNTER — Ambulatory Visit: Attending: Cardiovascular Disease | Admitting: Cardiovascular Disease

## 2024-01-29 VITALS — BP 130/80 | HR 68 | Ht 64.0 in | Wt 162.5 lb

## 2024-01-29 DIAGNOSIS — I10 Essential (primary) hypertension: Secondary | ICD-10-CM

## 2024-01-29 DIAGNOSIS — I498 Other specified cardiac arrhythmias: Secondary | ICD-10-CM

## 2024-01-29 DIAGNOSIS — E7800 Pure hypercholesterolemia, unspecified: Secondary | ICD-10-CM | POA: Diagnosis not present

## 2024-01-29 DIAGNOSIS — R079 Chest pain, unspecified: Secondary | ICD-10-CM

## 2024-01-29 MED ORDER — DOXAZOSIN MESYLATE 1 MG PO TABS: 1.0000 mg | ORAL_TABLET | Freq: Two times a day (BID) | ORAL | 3 refills | Status: AC

## 2024-01-29 NOTE — Patient Instructions (Addendum)
 Medication Instructions:   Please stop amlodipine (causing leg swelling)  Please start cardura/doxazosin 1 mg twice a day  Monitor blood pressure  If you need a refill on your cardiac medications before your next appointment, please call your pharmacy.   Lab work: No new labs needed  Testing/Procedures: No new testing needed  Follow-Up: At [facility], you and your health needs are our priority.  As part of our continuing mission to provide you with exceptional heart care, we have created designated Provider Care Teams.  These Care Teams include your primary Cardiologist (physician) and Advanced Practice Providers (APPs -  Physician Assistants and Nurse Practitioners) who all work together to provide you with the care you need, when you need it.  You will need a follow up appointment in 12 months  Providers on your designated Care Team:   Daisy Meager, NP Daisy Bring, PA-C Daisy Barajas, [place]  COVID-19 Vaccine Information can be found at: [email or web address] For questions related to vaccine distribution or appointments, please email [email or web address] or call [phone].
